# Patient Record
Sex: Female | Born: 1935 | Race: White | Hispanic: No | Marital: Single | Smoking: Never smoker
Health system: Southern US, Community
[De-identification: ages and names within clinical notes are randomized; demographics above are authoritative.]

## PROBLEM LIST (undated history)

## (undated) DIAGNOSIS — K219 Gastro-esophageal reflux disease without esophagitis: Secondary | ICD-10-CM

## (undated) DIAGNOSIS — I10 Essential (primary) hypertension: Secondary | ICD-10-CM

## (undated) DIAGNOSIS — D329 Benign neoplasm of meninges, unspecified: Secondary | ICD-10-CM

## (undated) DIAGNOSIS — M1990 Unspecified osteoarthritis, unspecified site: Secondary | ICD-10-CM

## (undated) DIAGNOSIS — E039 Hypothyroidism, unspecified: Secondary | ICD-10-CM

## (undated) DIAGNOSIS — C44601 Unspecified malignant neoplasm of skin of unspecified upper limb, including shoulder: Secondary | ICD-10-CM

## (undated) DIAGNOSIS — H409 Unspecified glaucoma: Secondary | ICD-10-CM

## (undated) HISTORY — DX: Unspecified glaucoma: H40.9

## (undated) HISTORY — PX: TONSILLECTOMY: SUR1361

## (undated) HISTORY — DX: Hypothyroidism, unspecified: E03.9

## (undated) HISTORY — DX: Benign neoplasm of meninges, unspecified: D32.9

## (undated) HISTORY — PX: CATARACT EXTRACTION W/PHACO: SHX586

## (undated) HISTORY — DX: Unspecified malignant neoplasm of skin of unspecified upper limb, including shoulder: C44.601

---
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
## 1973-03-13 HISTORY — PX: LAMINECTOMY: SHX219

## 1988-03-13 DIAGNOSIS — M1990 Unspecified osteoarthritis, unspecified site: Secondary | ICD-10-CM

## 1988-03-13 HISTORY — DX: Unspecified osteoarthritis, unspecified site: M19.90

## 2001-03-13 HISTORY — PX: APPENDECTOMY: SHX54

## 2003-03-14 HISTORY — PX: RETINAL DETACHMENT SURGERY: SHX105

## 2008-01-22 ENCOUNTER — Ambulatory Visit: Payer: Self-pay | Admitting: Cardiology

## 2009-07-14 ENCOUNTER — Ambulatory Visit (HOSPITAL_COMMUNITY): Admission: RE | Admit: 2009-07-14 | Discharge: 2009-07-14 | Payer: Self-pay | Admitting: Interventional Radiology

## 2010-04-03 ENCOUNTER — Encounter: Payer: Self-pay | Admitting: General Surgery

## 2010-05-31 LAB — CBC
HCT: 37.6 % (ref 36.0–46.0)
MCHC: 33.6 g/dL (ref 30.0–36.0)
Platelets: 329 10*3/uL (ref 150–400)
RDW: 14.8 % (ref 11.5–15.5)

## 2010-05-31 LAB — APTT: aPTT: 29 seconds (ref 24–37)

## 2010-05-31 LAB — PROTIME-INR: INR: 0.92 (ref 0.00–1.49)

## 2013-04-09 ENCOUNTER — Encounter (HOSPITAL_COMMUNITY): Payer: Self-pay | Admitting: Pharmacy Technician

## 2013-04-14 ENCOUNTER — Encounter (HOSPITAL_COMMUNITY)
Admission: RE | Disposition: A | Discharge status: Home / Self Care | Payer: Self-pay | Source: Ambulatory Visit | Attending: Ophthalmology

## 2013-04-14 ENCOUNTER — Ambulatory Visit (HOSPITAL_COMMUNITY)
Admission: RE | Admit: 2013-04-14 | Discharge: 2013-04-14 | Disposition: A | Discharge status: Home / Self Care | Payer: Medicare Other | Source: Ambulatory Visit | Attending: Ophthalmology | Admitting: Ophthalmology

## 2013-04-14 ENCOUNTER — Encounter (HOSPITAL_COMMUNITY): Payer: Self-pay

## 2013-04-14 DIAGNOSIS — H4089 Other specified glaucoma: Secondary | ICD-10-CM | POA: Insufficient documentation

## 2013-04-14 DIAGNOSIS — I10 Essential (primary) hypertension: Secondary | ICD-10-CM | POA: Insufficient documentation

## 2013-04-14 HISTORY — DX: Unspecified osteoarthritis, unspecified site: M19.90

## 2013-04-14 HISTORY — DX: Essential (primary) hypertension: I10

## 2013-04-14 HISTORY — DX: Gastro-esophageal reflux disease without esophagitis: K21.9

## 2013-04-14 HISTORY — PX: SLT LASER APPLICATION: SHX6099

## 2013-04-14 SURGERY — SLT LASER APPLICATION
Anesthesia: LOCAL | Laterality: Left

## 2013-04-14 MED ORDER — TETRACAINE HCL 0.5 % OP SOLN
1.0000 [drp] | Freq: Once | OPHTHALMIC | Status: AC
  Administered 2013-04-14: 1 [drp] via OPHTHALMIC

## 2013-04-14 MED ORDER — TETRACAINE HCL 0.5 % OP SOLN
OPHTHALMIC | Status: AC
  Filled 2013-04-14: qty 2

## 2013-04-14 MED ORDER — PILOCARPINE HCL 1 % OP SOLN
OPHTHALMIC | Status: AC
  Filled 2013-04-14: qty 15

## 2013-04-14 MED ORDER — APRACLONIDINE HCL 1 % OP SOLN
1.0000 [drp] | OPHTHALMIC | Status: AC
  Administered 2013-04-14 (×3): 1 [drp] via OPHTHALMIC
  Filled 2013-04-14: qty 0.1

## 2013-04-14 MED ORDER — PILOCARPINE HCL 1 % OP SOLN
1.0000 [drp] | OPHTHALMIC | Status: AC
  Administered 2013-04-14 (×3): 1 [drp] via OPHTHALMIC

## 2013-04-14 MED ORDER — APRACLONIDINE HCL 1 % OP SOLN
OPHTHALMIC | Status: AC
  Filled 2013-04-14: qty 0.1

## 2013-04-14 NOTE — Discharge Instructions (Signed)
Jennifer James  04/14/2013     Instructions    Activity: No Restrictions.   Diet: Resume Diet you were on at home.   Pain Medication: Tylenol if Needed.   CONTACT YOUR DOCTOR IF YOU HAVE PAIN, REDNESS IN YOUR EYE, OR DECREASED VISION.   Follow-up February 24th @ 1:45 pm} with Williams Che, MD.    Dr. Iona Hansen: [phone]      If you find that you cannot contact your physician, but feel that your signs and   Symptoms warrant a physician's attention, call the Emergency Room at   [phone] [phone].

## 2013-04-14 NOTE — Brief Op Note (Signed)
CUBA NATARAJAN 04/14/2013  Williams Che, MD  Yag Laser Self Test Completedyes. Procedure: SLT,OS.  Eye Protection Worn by Staff yes. Laser In Use Sign on Door yes.  Laser: Nd:YAG Spot Size: Fixed Power Setting: 1.0 mJ/burst Position treated: 360 degrees trabecular meshwork Number of shots: 122 Total energy delivered: 121 mJ  The patient tolerated the procedure without difficulty. No complications were encountered.   The trabecular meshwork was easily identified with no pigment and 2 clock hours of angle recession without synechiae. Tenometer reading immediately after procedure:  19 mmHg.  The patient was discharged home with the instructions to continue all her current glaucoma medications, if any.   Patient instructed to go to office at as scheduled for intraocular pressure check.  Patient verbalizes understanding of discharge instructions yes.

## 2013-04-14 NOTE — H&P (Signed)
I have reviewed the pre printed H&P, the patient was re-examined, and I have identified no significant interval changes in the patient's medical condition.  There is no change in the plan of care since the history and physical of record. 

## 2013-04-14 NOTE — Op Note (Signed)
Procedure only, no op note required

## 2013-04-17 ENCOUNTER — Encounter (HOSPITAL_COMMUNITY): Payer: Self-pay | Admitting: Ophthalmology

## 2015-06-07 ENCOUNTER — Inpatient Hospital Stay
Admission: RE | Admit: 2015-06-07 | Discharge: 2015-06-26 | Disposition: A | Discharge status: Home / Self Care | Payer: Medicare Other | Source: Ambulatory Visit | Attending: *Deleted | Admitting: *Deleted

## 2015-06-07 DIAGNOSIS — R600 Localized edema: Principal | ICD-10-CM

## 2015-06-07 DIAGNOSIS — M79605 Pain in left leg: Secondary | ICD-10-CM

## 2015-06-08 ENCOUNTER — Encounter (HOSPITAL_COMMUNITY)
Admission: RE | Admit: 2015-06-08 | Discharge: 2015-06-08 | Disposition: A | Discharge status: Home / Self Care | Payer: Medicare Other | Source: Skilled Nursing Facility | Attending: Internal Medicine | Admitting: Internal Medicine

## 2015-06-08 ENCOUNTER — Encounter: Payer: Self-pay | Admitting: Internal Medicine

## 2015-06-08 ENCOUNTER — Non-Acute Institutional Stay (SKILLED_NURSING_FACILITY): Payer: Medicare Other | Admitting: Internal Medicine

## 2015-06-08 DIAGNOSIS — I10 Essential (primary) hypertension: Secondary | ICD-10-CM | POA: Insufficient documentation

## 2015-06-08 DIAGNOSIS — D649 Anemia, unspecified: Secondary | ICD-10-CM | POA: Insufficient documentation

## 2015-06-08 DIAGNOSIS — K219 Gastro-esophageal reflux disease without esophagitis: Secondary | ICD-10-CM | POA: Insufficient documentation

## 2015-06-08 DIAGNOSIS — S72142D Displaced intertrochanteric fracture of left femur, subsequent encounter for closed fracture with routine healing: Secondary | ICD-10-CM | POA: Diagnosis not present

## 2015-06-08 DIAGNOSIS — E039 Hypothyroidism, unspecified: Secondary | ICD-10-CM | POA: Insufficient documentation

## 2015-06-08 DIAGNOSIS — S72143A Displaced intertrochanteric fracture of unspecified femur, initial encounter for closed fracture: Secondary | ICD-10-CM | POA: Insufficient documentation

## 2015-06-08 LAB — CBC
HEMATOCRIT: 25 % — AB (ref 36.0–46.0)
HEMOGLOBIN: 8.5 g/dL — AB (ref 12.0–15.0)
MCH: 32.3 pg (ref 26.0–34.0)
MCHC: 34 g/dL (ref 30.0–36.0)
MCV: 95.1 fL (ref 78.0–100.0)
Platelets: 275 10*3/uL (ref 150–400)
RBC: 2.63 MIL/uL — ABNORMAL LOW (ref 3.87–5.11)
RDW: 13.8 % (ref 11.5–15.5)
WBC: 8.5 10*3/uL (ref 4.0–10.5)

## 2015-06-08 LAB — BASIC METABOLIC PANEL
ANION GAP: 8 (ref 5–15)
BUN: 19 mg/dL (ref 6–20)
CHLORIDE: 107 mmol/L (ref 101–111)
CO2: 27 mmol/L (ref 22–32)
Calcium: 8.2 mg/dL — ABNORMAL LOW (ref 8.9–10.3)
Creatinine, Ser: 0.7 mg/dL (ref 0.44–1.00)
GFR calc non Af Amer: >60 mL/min (ref >60)
Glucose, Bld: 87 mg/dL (ref 65–99)
POTASSIUM: 3.6 mmol/L (ref 3.5–5.1)
Sodium: 142 mmol/L (ref 135–145)

## 2015-06-08 NOTE — Progress Notes (Signed)
This is a comprehensive admission to [facility] personally performed by Unice Cobble MD on this date less than 30 days from date of admission. PCP:Dr Bluth HPI: Patient was @ [facility]  3/21-3/27/17 for surgical treatment of a closed intertrochanteric fracture of left femur with intramedullary device 06/02/15.  The fracture was sustained in a mechanical fall with no history of cardio neurologic prodrome. Prophylactic Lovenox was administered for prevention of DVTs. Early ambulation was pursued along with sequential compression devices. Pain control was felt to be adequate on oral medications. There were no apparent operative complications; although, her H&H were 8.5 and 25 on 06/08/15. She denies a history of anemia or any bleeding dyscrasias except for minor bruising.  Past medical and surgical history: She has a history of hypertension, hypothyroidism, and Esophageal reflux. Pertinent past surgical history includes laminectomy in 1975.  Social history: She has never smoked. She does not drink. 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  Comprehensive review of systems: She has occasional dysphagia. She states that her TSH was therapeutic in October at her primary care physician's office. She felt as if she were going to have a panic attack upon arrival at this facility. This is atypical for her in the colon she states that she is totally independent individual Constitutional: No fever, chills, significant weight change, fatigue or night sweats Eyes: No redness, discharge, pain, blurred vision, double vision or loss of vision ENT/mouth: No nasal congestion, postnasal drainage,epistaxis, purulent discharge, earache, hearing loss, tinnitus ,sore throat , dental pain or hoarseness   Cardiovascular: No chest pain, palpitations, racing, irregular rhythm, syncope, claudication or edema    Respiratory: No cough, sputum production,hemoptysis,  dyspnea, paroxysmal nocturnal dyspnea, pleuritic chest pain, significant snoring or  apnea    Gastrointestinal: No heartburn,nausea and vomiting,abdominal pain, diarrhea, significant constipation, rectal bleeding or melena Genitourinary: No dysuria,hematuria, pyuria, frequency, urgency,  incontinence, nocturia, or dark urine  Musculoskeletal: No myalgias or muscle cramping, joint stiffness, joint swelling, joint color change, or weakness other than related to her hip fracture Dermatologic: No rash, pruritus, urticaria, or change in color or temperature of skin Neurologic: No headache, vertigo, limb weakness, tremor, gait disturbance, seizures, memory loss, numbness or tingling Psychiatric: No significant anxiety or depression, insomnia, or anorexia Endocrine: No change in hair/skin/ nails, excessive thirst, excessive hunger, excessive urination or unexplained fatigue Hematologic/lymphatic: No lymphadenopathy or  abnormal clotting Allergy/immunology: No itchy/ watery eyes, significant sneezing, rhinitis, urticaria or angioedema  Physical exam:  Pertinent or positive findings: She is a very articulate individual, animated and communicative. A faint left carotid bruit is noted. She has fusiform changes of her knees with crepitus.  General appearance:Adequately nourished; no acute distress or increased work of breathing is present.   Lymphatic: No  lymphadenopathy about the head, neck, or axilla . Eyes: No conjunctival inflammation or lid edema is present. There is no scleral icterus. Ears:  External ear exam shows no significant lesions or deformities.   Nose:  External nasal examination shows no deformity or inflammation. Nasal mucosa are pink and moist without lesions or exudates No septal dislocation or deviation.No obstruction to airflow.  Oral exam: Dental hygiene is excellent; lips and gums are healthy appearing.There is no oropharyngeal  erythema or exudate . Neck:  No deformities, thyromegaly, masses, or tenderness noted.     Heart:  Normal rate and regular rhythm. S1 and S2 normal  without gallop, murmur, click, rub or other extra sounds.  Lungs:Chest clear to auscultation; no wheezes, rhonchi,rales ,or rubs present. Abdomen:Bowel sounds are normal. Abdomen is soft and nontender with no organomegaly, hernias  or masses. GU: deferred as previously addressed. Extremities:  No cyanosis, edema, or clubbing noted. Neurologic exam :Strength equal & normal in upper & lower extremities Deep tendon reflexes are normal in biceps. Skin: Warm & dry w/o tenting or jaundice. No significant lesions or rash.  See summary under each active problem in the Problem List with associated updated therapeutic plan

## 2015-06-08 NOTE — Patient Instructions (Signed)
See summary under each active problem in the Problem List with associated updated therapeutic plan. Completed document faxed to [facility]. 

## 2015-06-08 NOTE — Assessment & Plan Note (Signed)
Continue physical therapy

## 2015-06-08 NOTE — Assessment & Plan Note (Signed)
Maintain blood pressure less than 140/90 on average

## 2015-06-08 NOTE — Assessment & Plan Note (Signed)
Serially monitor H&H

## 2015-06-09 ENCOUNTER — Encounter (HOSPITAL_COMMUNITY)
Admission: RE | Admit: 2015-06-09 | Discharge: 2015-06-09 | Disposition: A | Discharge status: Home / Self Care | Payer: Medicare Other | Source: Skilled Nursing Facility | Attending: *Deleted | Admitting: *Deleted

## 2015-06-09 LAB — CBC
HEMATOCRIT: 25.9 % — AB (ref 36.0–46.0)
HEMOGLOBIN: 8.6 g/dL — AB (ref 12.0–15.0)
MCH: 31.7 pg (ref 26.0–34.0)
MCHC: 33.2 g/dL (ref 30.0–36.0)
MCV: 95.6 fL (ref 78.0–100.0)
Platelets: 329 10*3/uL (ref 150–400)
RBC: 2.71 MIL/uL — ABNORMAL LOW (ref 3.87–5.11)
RDW: 14.1 % (ref 11.5–15.5)
WBC: 8.5 10*3/uL (ref 4.0–10.5)

## 2015-06-15 ENCOUNTER — Encounter: Payer: Self-pay | Admitting: Internal Medicine

## 2015-06-15 ENCOUNTER — Inpatient Hospital Stay (HOSPITAL_COMMUNITY)
Admit: 2015-06-15 | Discharge: 2015-06-15 | Disposition: A | Discharge status: Home / Self Care | Payer: Medicare Other | Attending: Internal Medicine | Admitting: Internal Medicine

## 2015-06-15 ENCOUNTER — Non-Acute Institutional Stay (SKILLED_NURSING_FACILITY): Payer: Medicare Other | Admitting: Internal Medicine

## 2015-06-15 DIAGNOSIS — E038 Other specified hypothyroidism: Secondary | ICD-10-CM

## 2015-06-15 DIAGNOSIS — S72142D Displaced intertrochanteric fracture of left femur, subsequent encounter for closed fracture with routine healing: Secondary | ICD-10-CM | POA: Diagnosis not present

## 2015-06-15 DIAGNOSIS — R609 Edema, unspecified: Secondary | ICD-10-CM

## 2015-06-15 DIAGNOSIS — D62 Acute posthemorrhagic anemia: Secondary | ICD-10-CM

## 2015-06-15 DIAGNOSIS — I10 Essential (primary) hypertension: Secondary | ICD-10-CM

## 2015-06-15 NOTE — Progress Notes (Signed)
Patient ID: Jennifer James, female   DOB: [birth year], 82 y.o.   MRN: [id number]    This is an acute visit.  Level care skilled.  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  Patient is a pleasant 82 year old female who sustained a fracture of her left hip  The fracture was sustained in a mechanical fall with no history of cardio neurologic prodrome. Prophylactic Lovenox was administered for prevention of DVTs. Status post repair Early ambulation was pursued along with sequential compression devices. Pain control was felt to be adequate on oral medications. There were no apparent operative complications; although, her H&H were 8.5 and 25 on 06/08/15. She denies a history of anemia or any bleeding dyscrasias except for minor bruising. She continues to work with therapy although she says she has some increased left hip discomfort at times.  She does have Vicodin as needed for pain.  Her RP as well as patient have noticed some increased edema of her left leg.  She is on aspirin 81 mg twice a day for prophylaxis.  Her other medical issues appear to be relatively stable.  She says she does feel bit chilly at night lower extremities she does have a history of hypothyroidism on Synthroid we will update a TSH.  Blood pressure appears to be stable most recently 128/67-120/64-115/71-she is on lisinopril 20 mg daily as well as Norvasc 10 mg a day.    Past medical and surgical history: She has a history of hypertension, hypothyroidism, and Esophageal reflux. Pertinent past surgical history includes laminectomy in 1975.  Social history: She has never smoked. She does not drink. 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  Medications have been reviewed.  They  include.  Aspirin 81 mg twice a day until the eighth 2017.  Cervix acid.  Calcium citrate 200 mg daily.  Vitamin D 3000 units daily.  Acerbic acid 500 mg twice a day.  Vitamin B12 1000 g daily.  Hydrocodone a CPT 5-3 25 mg-one tablet for moderate pain to test her severe pain when necessary every 6 hours.  Lisinopril 20 mg daily.  Norvasc 10 mg daily.  Ranitidine 150 mg twice a day.  Synthroid 88 g daily.    review of systems: She has occasional dysphagia. She states that her TSH was therapeutic in October at her primary care physician's office.   Constitutional: No fever, chills, significant weight change, fatigue or night sweats says her legs do feel cold at night Eyes: No redness, discharge, pain, blurred vision, double vision or loss of vision ENT/mouth: No nasal congestion, postnasal drainage,epistaxis, purulent discharge, earache, hearing loss, tinnitus ,sore throat , dental pain or hoarseness   Cardiovascular: No chest pain, palpitations, racing, irregular rhythm, syncope, claudication or edema except for left lower extremity edema Respiratory: No cough,    at times will have shortness of breath during exertion in therapy  Gastrointestinal: No heartburn,nausea and vomiting,abdominal pain, diarrhea, significant constipation, rectal bleeding or melena Genitourinary: No dysuria,  Musculoskeletal: No myalgias or muscle cramping, joint stiffness, joint swelling, joint color change, or weakness other than related to her hip fracture Dermatologic: No rash, pruritus, urticaria, or change in color or temperature of skin Neurologic:  No headache or dizziness Psychiatric: No significant anxiety or depression, insomnia, or anorexia  Endocrine: No change in hair/skin/ nails, excessive thirst, excessive hunger, excessive urination or unexplained fatigue does complain of some coldness of her lower extremities at night Hematologic/lymphatic: No lymphadenopathy or  abnormal clotting  some bruising around surgical site Allergy/immunology: No itchy/ watery eyes, significant sneezing, rhinitis, urticaria or angioedema  Physical exam:   Temp--98.8 pulse 83 respirations 20 blood pressure 128/67  General appearance:Adequately nourished; no acute distress or increased work of breathing is present.   . Eyes: No conjunctival inflammation or lid edema is present. There is no scleral icterus. Ears:  External ear exam shows no significant lesions or deformities.   Nose:  External nasal examination shows no deformity or inflammation. Nasal mucosa are pink and moist without lesions or exudates .  Oral exam: Dental hygiene is excellent; lips and gums are healthy appearing.There is no oropharyngeal erythema or exudate . Marland Kitchen     Heart:  Normal rate and regular rhythm. S1 and S2 normal without gallop, murmur, click, rub or other extra sounds.  Lungs:Chest clear to auscultation; no wheezes, rhonchi,rales ,or rubs present. Abdomen:Bowel sounds are normal. Abdomen is soft and nontender with no organomegaly, hernias  or masses.  Extremities:  Moves upper extremities at baseline-ambulates in a wheelchair without difficulty-I do note she has 1-2 plus edema of her left leg-this is nonerythematous with slight tenderness to palpation--without increased warmth-- has a reduced pedal pulse on the left versus the right Surgical site left hip discovered-there is a small amount of violaceous bruising extending beyond the dressing according to RP this is improving Neurologic exam :Strength equal & normal in upper & lower extremities-touch sensation appears to be intact lower extremities bilaterally-there is no increased warmth or coldness to touch--pedal pulse is intact right lower extremity Skin: Warm & dry w/o tenting or jaundice. No significant lesions or rash.  Psych she is alert and oriented pleasant and appropriate quite conversant.  Labs.  06/09/2015.  WBC 8.5 hemoglobin 8.6 platelets  329.  Sodium 142 potassium 3.6 BUN 19 creatinine 0.7.  Assessment and plan.  History of left hip fracture status post repair-apparently she is having some discomfort with therapy-will order an x-ray of the left hip and leg-she is receiving Vicodin for pain apparently this is helping.  In regards to increased edema on the left leg will order a venous Doppler to rule out any DVT-she is on aspirin twice a day for anticoagulation.  #3 history of hypothyroidism she is complaining of somewhat cold legs at night will start by ordering a TSH here she does have a history hypothyroidism apparently TSH was within normal limits in October-I did try to access what medical records we could from Epic but there is quite limited information since her providers  are outside it appears the [facility] system.  #4 history of hypertension this appears to be stable recent blood pressures as noted above she is on lisinopril as well as Norvasc.  #5 history of anemia I suspect postoperative appears previous hemoglobin for surgery was around 12 did drop to 8.5 and then updated at 8.6-will order a CBC tomorrow for follow-up.  Clinically patient appears to be doing relatively well although will have to do a venous Doppler secondary to left leg edema as well as an x-ray secondary to some pain-again will update blood work as well to keep an eye on her hemoglobin and order a BMP to keep an eye on her electrolytes renal function with a history of hypertension  [id number]-of note greater than 35 minutes spent assessing  patient-discussing her status with her responsible party at bedside-reviewing her chart and meds and labs-and coordinating form formulating a plan of care for numerous diagnoses-of note greater than 50% of time spent coordinating plan of care

## 2015-06-16 ENCOUNTER — Encounter (HOSPITAL_COMMUNITY)
Admission: RE | Admit: 2015-06-16 | Discharge: 2015-06-16 | Disposition: A | Discharge status: Home / Self Care | Payer: Medicare Other | Source: Skilled Nursing Facility | Attending: Internal Medicine | Admitting: Internal Medicine

## 2015-06-16 ENCOUNTER — Ambulatory Visit (HOSPITAL_COMMUNITY)
Admission: RE | Admit: 2015-06-16 | Discharge: 2015-06-16 | Disposition: A | Discharge status: Home / Self Care | Payer: Medicare Other | Source: Ambulatory Visit | Attending: Internal Medicine | Admitting: Internal Medicine

## 2015-06-16 ENCOUNTER — Ambulatory Visit (HOSPITAL_COMMUNITY)
Admit: 2015-06-16 | Discharge: 2015-06-16 | Disposition: A | Discharge status: Home / Self Care | Payer: Medicare Other | Attending: Internal Medicine | Admitting: Internal Medicine

## 2015-06-16 DIAGNOSIS — E039 Hypothyroidism, unspecified: Secondary | ICD-10-CM | POA: Insufficient documentation

## 2015-06-16 DIAGNOSIS — M79605 Pain in left leg: Secondary | ICD-10-CM | POA: Insufficient documentation

## 2015-06-16 DIAGNOSIS — M6281 Muscle weakness (generalized): Secondary | ICD-10-CM | POA: Insufficient documentation

## 2015-06-16 DIAGNOSIS — R600 Localized edema: Secondary | ICD-10-CM | POA: Insufficient documentation

## 2015-06-16 DIAGNOSIS — M810 Age-related osteoporosis without current pathological fracture: Secondary | ICD-10-CM | POA: Insufficient documentation

## 2015-06-16 DIAGNOSIS — I10 Essential (primary) hypertension: Secondary | ICD-10-CM | POA: Insufficient documentation

## 2015-06-16 DIAGNOSIS — Z4789 Encounter for other orthopedic aftercare: Secondary | ICD-10-CM | POA: Insufficient documentation

## 2015-06-16 LAB — CBC WITH DIFFERENTIAL/PLATELET
Basophils Absolute: 0 10*3/uL (ref 0.0–0.1)
Basophils Relative: 0 %
EOS ABS: 0.7 10*3/uL (ref 0.0–0.7)
EOS PCT: 7 %
HCT: 28.7 % — ABNORMAL LOW (ref 36.0–46.0)
Hemoglobin: 9.2 g/dL — ABNORMAL LOW (ref 12.0–15.0)
Lymphocytes Relative: 15 %
Lymphs Abs: 1.4 10*3/uL (ref 0.7–4.0)
MCH: 31.5 pg (ref 26.0–34.0)
MCHC: 32.1 g/dL (ref 30.0–36.0)
MCV: 98.3 fL (ref 78.0–100.0)
Monocytes Absolute: 0.8 10*3/uL (ref 0.1–1.0)
Monocytes Relative: 9 %
NEUTROS ABS: 6.2 10*3/uL (ref 1.7–7.7)
NEUTROS PCT: 69 %
PLATELETS: 592 10*3/uL — AB (ref 150–400)
RBC: 2.92 MIL/uL — AB (ref 3.87–5.11)
RDW: 15.5 % (ref 11.5–15.5)
WBC: 9.1 10*3/uL (ref 4.0–10.5)

## 2015-06-16 LAB — BASIC METABOLIC PANEL
ANION GAP: 7 (ref 5–15)
BUN: 19 mg/dL (ref 6–20)
CO2: 28 mmol/L (ref 22–32)
Calcium: 8.8 mg/dL — ABNORMAL LOW (ref 8.9–10.3)
Chloride: 107 mmol/L (ref 101–111)
Creatinine, Ser: 0.79 mg/dL (ref 0.44–1.00)
GFR calc Af Amer: >60 mL/min (ref >60)
GLUCOSE: 93 mg/dL (ref 65–99)
POTASSIUM: 4 mmol/L (ref 3.5–5.1)
Sodium: 142 mmol/L (ref 135–145)

## 2015-06-16 LAB — TSH: TSH: 5.296 u[IU]/mL — AB (ref 0.350–4.500)

## 2015-06-21 ENCOUNTER — Other Ambulatory Visit: Payer: Self-pay | Admitting: *Deleted

## 2015-06-21 MED ORDER — HYDROCODONE-ACETAMINOPHEN 5-325 MG PO TABS: 1.0000 | ORAL_TABLET | Freq: Four times a day (QID) | ORAL | Status: DC | PRN

## 2015-06-21 NOTE — Telephone Encounter (Signed)
[facility] 

## 2015-06-22 ENCOUNTER — Encounter: Payer: Self-pay | Admitting: Internal Medicine

## 2015-06-22 ENCOUNTER — Non-Acute Institutional Stay (SKILLED_NURSING_FACILITY): Payer: Medicare Other | Admitting: Internal Medicine

## 2015-06-22 DIAGNOSIS — E038 Other specified hypothyroidism: Secondary | ICD-10-CM | POA: Diagnosis not present

## 2015-06-22 DIAGNOSIS — D649 Anemia, unspecified: Secondary | ICD-10-CM

## 2015-06-22 DIAGNOSIS — I10 Essential (primary) hypertension: Secondary | ICD-10-CM

## 2015-06-22 DIAGNOSIS — S72142D Displaced intertrochanteric fracture of left femur, subsequent encounter for closed fracture with routine healing: Secondary | ICD-10-CM | POA: Diagnosis not present

## 2015-06-22 NOTE — Progress Notes (Signed)
Patient ID: Jennifer James, female   DOB: 1928-05-28, 82 y.o.   MRN: [id number]  Location:  [place] of Service:  SNF (31)    PCP: Celedonio Savage, MD Patient Care Team: Celedonio Savage, MD as PCP - General (Family Medicine)  Extended Emergency Contact Information Primary Emergency Contact: Mauri Brooklyn & Joseph Art [place] of [place] Phone: [phone] Relation: Other Secondary Emergency Contact: Deneen Harts States of [place] Phone: [phone] Relation: Friend   Goals of care:  Advanced Directive information Advanced Directives 06/22/2015  Does patient have an advance directive? Yes  Type of Advance Directive Out of facility DNR (pink MOST or yellow form)  Does patient want to make changes to advanced directive? No - Patient declined  Copy of advanced directive(s) in chart? Yes     Allergies  Allergen Reactions  . Penicillins Swelling and Other (See Comments)    Patient states she turns red    Chief Complaint  Patient presents with  . Discharge Note    HPI:  82 y.o. female  seen today for discharge-she was here for rehabilitation after sustaining a left hip fracture that was surgically repaired.  Her stay here is been fairly unremarkable she was complaining of some continued hip pain but x-rays did not show any acute process he also did a venous Doppler secondary to edema on the left side and this was negative as well.  She appears to be doing a bit better Vicodin appears to be controlling the pain when needed.  She did have postop anemia but hemoglobin has risen from the mid eights up to 9.2 on most recent lab.  She also has a history of hypertension on lisinopril and Norvasc this appears stable recent blood pressures 117/67-139/67.  Currently she has no complaints she is looking forward to going home she is fairly independent at home she does have close friends who do watch after her quite closely.  She will need PT and OT for  strengthening as well as a rolling walker.Wheelchair as well as shower chair secondary to continued fall risk      Past Medical History  Diagnosis Date  . Hypertension   . GERD (gastroesophageal reflux disease)   . Osteoarthritis 1990  . Hypothyroidism   . Glaucoma   . Meningioma ([place])     Monitored at [facility]  . Primary cancer of skin of hand     Past Surgical History  Procedure Laterality Date  . Cataract extraction w/phaco Bilateral   . Retinal detachment surgery Left 2005  . Tonsillectomy    . Appendectomy  2003  . Laminectomy  1975  . Strabismus surgery Bilateral 1955    6 total surgeries  . Slt laser application Left [id number]    Procedure: SLT LASER APPLICATION;  Surgeon: Williams Che, MD;  Location: [facility];  Service: Ophthalmology;  Laterality: Left;      reports that she has never smoked. She has never used smokeless tobacco. She reports that she does not drink alcohol or use illicit drugs. Social History   Social History  . Marital Status: Single    Spouse Name: N/A  . Number of Children: N/A  . Years of Education: N/A   Occupational History  . Not on file.   Social History Main Topics  . Smoking status: Never Smoker   . Smokeless tobacco: Never Used  . Alcohol Use: No  . Drug Use: No  . Sexual Activity: Not on  file   Other Topics Concern  . Not on file   Social History Narrative   Medications have been reviewed per [facility] include a cervical acid 500 mg twice a day.  Aspirin 81 mg twice a day until May 9 at which time we will cold once a day.  Calcium citrate 200 mg daily.  Vitamin D 1000 units daily.  Vitamin B12 thousand micrograms daily.  Vicodin 5-3 25 mg every 6 hours when necessary.  2 tabs every 6 hours for severe pain.  Lisinopril 20 mg daily.  Norvasc 10 mg daily.  Synthroid currently 100 g a day will reducethis to 88 g a day per Dr. Clayborn Heron assessment       Allergies  Allergen Reactions  . Penicillins  Swelling and Other (See Comments)    Patient states she turns red    Pertinent  Health Maintenance Due  Topic Date Due  . DEXA SCAN  09/19/1995  . PNA vac Low Risk Adult (1 of 2 - PCV13) 09/19/1995  . INFLUENZA VACCINE  10/12/2015    Medications:   Review of Systems   In general no complaints of fever or chills says she feels well looking forward to going home.  Skin does not complain of rashes or itching bruising around her surgical site appears to be improving.  Head ears eyes nose mouth and throat does not complain of visual changes or sore throat.  Respiratory does not complain of shortness breath or cough.  Cardiac no chest pain.  Edema left leg appears to be slightly improved DVT ruled out via Doppler.  GI does not complain of nausea vomiting diarrhea constipation or abdominal pain.  GU does not complain of dysuria.  Muscle skeletal still has joint pain in the hip Vicodin apparently is effective still has some weakness.  Neurologic is not complaining of dizziness headache or numbness.  In psych appears to be somewhat anxious at times but does not complain of depression or uncontrollable anxiety  Filed Vitals:   06/22/15 1144  BP: 117/67  Pulse: 85  Temp: 98.1 F (36.7 C)  TempSrc: Oral  Resp: 19  Height: 5\' 3"  (1.6 m)  Weight: 138 lb 3.2 oz (62.687 kg)   Body mass index is 24.49 kg/(m^2). Physical Exam   Temperature 98.1 pulse 85 respirations 19 blood pressure 117/67.  In general this is a pleasant elderly female in no distress sitting for flu in her wheelchair.  Her skin is warm and dry surgical site left hip currently covered with a bruising around side appears to be slowly resolving it is not warm or acutely tender.  Eyes pupils appear reactive to light sclera and conjunctiva are clear she has prescription lenses visual acuity appears grossly intact.  Oropharynx clear mucous membranes moist.  Chest is clear to auscultation there is no labored  breathing.  Heart is regular rate and rhythm without murmur gallop or rub continues with some left leg edema approximately 2+ however this appears to be  slighty improved somewhat from previous exam pedal pulse is intact.  Abdomen soft nontender positive bowel sounds.  Muscle skeletal she is able to get up unassisted in ambulate but still have some weakness here and slight unsteadiness.  Neurologic is grossly intact speech is clear no lateralizing findings.  Psych she is alert and oriented pleasant and appropriate  Labs reviewed: Basic Metabolic Panel:  Recent Labs  06/08/15 0720 06/16/15 0710  NA 142 142  K 3.6 4.0  CL 107 107  CO2  27 28  GLUCOSE 87 93  BUN 19 19  CREATININE 0.70 0.79  CALCIUM 8.2* 8.8*   Liver Function Tests: No results for input(s): AST, ALT, ALKPHOS, BILITOT, PROT, ALBUMIN in the last 8760 hours. No results for input(s): LIPASE, AMYLASE in the last 8760 hours. No results for input(s): AMMONIA in the last 8760 hours. CBC:  Recent Labs  06/08/15 0720 06/09/15 0740 06/16/15 0710  WBC 8.5 8.5 9.1  NEUTROABS  --   --  6.2  HGB 8.5* 8.6* 9.2*  HCT 25.0* 25.9* 28.7*  MCV 95.1 95.6 98.3  PLT 275 329 592*   Cardiac Enzymes: No results for input(s): CKTOTAL, CKMB, CKMBINDEX, TROPONINI in the last 8760 hours. BNP: Invalid input(s): POCBNP CBG: No results for input(s): GLUCAP in the last 8760 hours.  Procedures and Imaging Studies During Stay: Dg Pelvis 1-2 Views  06/16/2015  CLINICAL DATA:  Lt hip Fx x 2 wks ago. Pt states she has been having increased pain in Lt lower extremity x several days. Increased Lt lower extremity swelling. No new injury since surgery. Posterior bruising extending from Lt buttock to Lt lower leg. EXAM: PELVIS - 1-2 VIEW COMPARISON:  06/01/2015 FINDINGS: Since the prior exam, 2 screws supported by an intra medullary rod have reduces the intertrochanteric fracture. Fracture components are well aligned. There is no evidence of a  new fracture. The orthopedic hardware is well-seated with no evidence loosening. Hip joints, SI joints and symphysis pubis are normally aligned. Bones are diffusely demineralized. IMPRESSION: 1. No new fracture.  No dislocation. 2. Left intertrochanteric fracture components are well aligned following ORIF. Orthopedic hardware with no evidence of loosening. Electronically Signed   By: Lajean Manes M.D.   On: 06/16/2015 08:14   Dg Tibia/fibula Left  06/16/2015  CLINICAL DATA:  Lt hip Fx x 2 wks ago. Pt states she has been having increased pain in Lt lower extremity x several days. Increased Lt lower extremity swelling. No new injury since surgery. Posterior bruising extending from Lt buttock to Lt lower leg. EXAM: LEFT TIBIA AND FIBULA - 2 VIEW COMPARISON:  06/01/2015 FINDINGS: Marginal spurs about all 3 compartments of the knee. Narrowing of the articular cartilage most evident in the medial compartment. No fracture or dislocation. Distal tibia and fibula intact. The distal aspect of femoral IM rod is partially visualized. Calcaneal spur at the plantar aponeurosis. IMPRESSION: 1. Negative for fracture or other acute abnormality. 2. Tricompartmental DJD in the knee, most marked in the medial compartment. Electronically Signed   By: Lucrezia Europe M.D.   On: 06/16/2015 08:15   US Venous Img Lower Unilateral Left  06/16/2015  CLINICAL DATA:  82 year old female with left lower extremity pain, left hip fracture surgery 2 weeks ago. Initial encounter. EXAM: LEFT LOWER EXTREMITY VENOUS DOPPLER ULTRASOUND TECHNIQUE: Gray-scale sonography with graded compression, as well as color Doppler and duplex ultrasound were performed to evaluate the lower extremity deep venous systems from the level of the common femoral vein and including the common femoral, femoral, profunda femoral, popliteal and calf veins including the posterior tibial, peroneal and gastrocnemius veins when visible. The superficial great saphenous vein was also  interrogated. Spectral Doppler was utilized to evaluate flow at rest and with distal augmentation maneuvers in the common femoral, femoral and popliteal veins. COMPARISON:  Left tib-fib 06/15/2015 FINDINGS: Contralateral Common Femoral Vein: Respiratory phasicity is normal and symmetric with the symptomatic side. No evidence of thrombus. Normal compressibility. Common Femoral Vein: No evidence of thrombus. Normal compressibility, respiratory  phasicity and response to augmentation. Saphenofemoral Junction: No evidence of thrombus. Normal compressibility and flow on color Doppler imaging. Profunda Femoral Vein: No evidence of thrombus. Normal compressibility and flow on color Doppler imaging. Femoral Vein: No evidence of thrombus. Normal compressibility, respiratory phasicity and response to augmentation. Popliteal Vein: No evidence of thrombus. Normal compressibility, respiratory phasicity and response to augmentation. Calf Veins: Limited due to subcutaneous ache Dina. No thrombus identified. Superficial Great Saphenous Vein: No evidence of thrombus. Normal compressibility and flow on color Doppler imaging. Venous Reflux:  None. Other Findings: Extensive subcutaneous edema in the distal left lower extremity and ankle. IMPRESSION: 1.  No evidence of left lower extremity deep venous thrombosis. 2. Extensive subcutaneous edema in the distal left lower extremity. Electronically Signed   By: Genevie Ann M.D.   On: 06/16/2015 16:37   Dg Femur Min 2 Views Left  06/16/2015  CLINICAL DATA:  Lt hip Fx x 2 wks ago. Pt states she has been having increased pain in Lt lower extremity x several days. Increased Lt lower extremity swelling. No new injury since surgery. Posterior bruising extending from Lt buttock to Lt lower leg. EXAM: LEFT FEMUR 2 VIEWS COMPARISON:  06/01/2015 FINDINGS: Interval internal fixation across the intertrochanteric fracture with IM rod, 2 proximal and 1 distal interlocking screws, intact without surrounding  lucency. Fracture fragments in near anatomic alignment. Mild osteopenia. Tricompartmental knee degenerative spurring. No acute fracture or dislocation. IMPRESSION: 1. Interval ORIF of left femur intertrochanteric fracture Electronically Signed   By: Lucrezia Europe M.D.   On: 06/16/2015 08:14    Assessment/Plan:    1 history of left hip fracture status post-this appears stable she will need continued PT and OT has Vicodin for pain control is on aspirin 81 mg twice a day for 6 week course for DVT prophylaxis again Doppler was negative of left leg. She also will need a rolling walker as well as shower chair and wheelchair for ambulation secondary to continued weakness and risk for falls  #2-history of hypertension this appears stable on lisinopril and Norvasc as noted above.  #3 history of postop anemia hemoglobin appears to be rising currently at 9.2.  We will update this before discharge.  #4-history hypothyroidism TSH came back mildly elevated at 5.296 at this point will continue dose of 88 g per Dr. Clayborn Heron assessment-patient is stable at this dose and suspect a dose increase would not be warranted per assessment by Dr. Linna Darner  [phone] note greater than 30 minutes spent on this discharge summary-greater than 50% of time spent coordinating plan of care

## 2015-06-23 ENCOUNTER — Encounter (HOSPITAL_COMMUNITY)
Admission: RE | Admit: 2015-06-23 | Discharge: 2015-06-23 | Disposition: A | Discharge status: Home / Self Care | Payer: Medicare Other | Source: Skilled Nursing Facility | Attending: Internal Medicine | Admitting: Internal Medicine

## 2015-06-23 ENCOUNTER — Other Ambulatory Visit: Payer: Self-pay

## 2015-06-23 LAB — CBC
HCT: 31.6 % — ABNORMAL LOW (ref 36.0–46.0)
HEMOGLOBIN: 10.1 g/dL — AB (ref 12.0–15.0)
MCH: 31.4 pg (ref 26.0–34.0)
MCHC: 32 g/dL (ref 30.0–36.0)
MCV: 98.1 fL (ref 78.0–100.0)
Platelets: 584 10*3/uL — ABNORMAL HIGH (ref 150–400)
RBC: 3.22 MIL/uL — ABNORMAL LOW (ref 3.87–5.11)
RDW: 15.2 % (ref 11.5–15.5)
WBC: 9.1 10*3/uL (ref 4.0–10.5)

## 2015-06-23 MED ORDER — HYDROCODONE-ACETAMINOPHEN 5-325 MG PO TABS: 1.0000 | ORAL_TABLET | Freq: Four times a day (QID) | ORAL | Status: AC | PRN

## 2015-06-23 NOTE — Telephone Encounter (Signed)
Rx faxed to [facility] at [phone].   Phone #: [phone]  

## 2015-06-27 NOTE — Progress Notes (Signed)
Patient ID: Jennifer James, female   DOB: 1928/09/11, 82 y.o.   MRN: [id number]

## 2015-07-14 ENCOUNTER — Encounter (HOSPITAL_COMMUNITY)
Admission: RE | Admit: 2015-07-14 | Discharge: 2015-07-14 | Disposition: A | Discharge status: Home / Self Care | Payer: Medicare Other | Source: Skilled Nursing Facility | Attending: Internal Medicine | Admitting: Internal Medicine

## 2015-07-14 DIAGNOSIS — I10 Essential (primary) hypertension: Secondary | ICD-10-CM | POA: Insufficient documentation

## 2015-07-14 DIAGNOSIS — Z4789 Encounter for other orthopedic aftercare: Secondary | ICD-10-CM | POA: Insufficient documentation

## 2015-07-14 DIAGNOSIS — E039 Hypothyroidism, unspecified: Secondary | ICD-10-CM | POA: Insufficient documentation

## 2015-07-14 DIAGNOSIS — M6281 Muscle weakness (generalized): Secondary | ICD-10-CM | POA: Insufficient documentation

## 2015-07-14 DIAGNOSIS — M810 Age-related osteoporosis without current pathological fracture: Secondary | ICD-10-CM | POA: Insufficient documentation

## 2015-07-22 ENCOUNTER — Encounter (HOSPITAL_COMMUNITY)
Admission: AD | Admit: 2015-07-22 | Discharge: 2015-07-22 | Disposition: A | Discharge status: Home / Self Care | Payer: Medicare Other | Source: Skilled Nursing Facility | Attending: *Deleted | Admitting: *Deleted

## 2017-06-20 IMAGING — DX DG FEMUR 2+V*L*
4 series · 4 of 4 positions shown · non-contrast
Comparison: 06/01/2015

CLINICAL DATA: Lt hip Fx x 2 wks ago. Pt states she has been having
increased pain in Lt lower extremity x several days. Increased Lt
lower extremity swelling. No new injury since surgery. Posterior
bruising extending from Lt buttock to Lt lower leg.

EXAM:
LEFT FEMUR 2 VIEWS

[femur ap (1 of 2)]
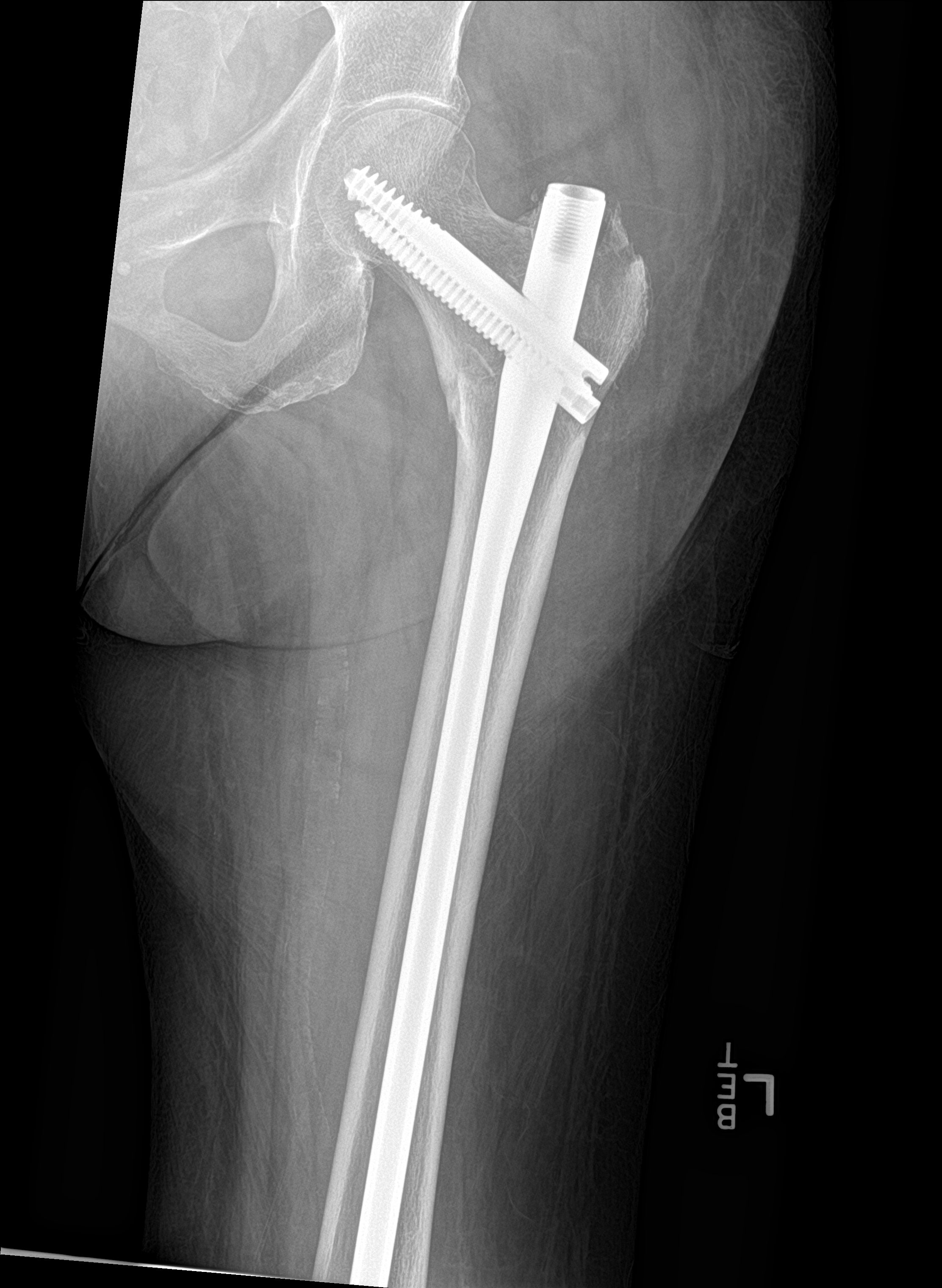

[femur ap (2 of 2)]
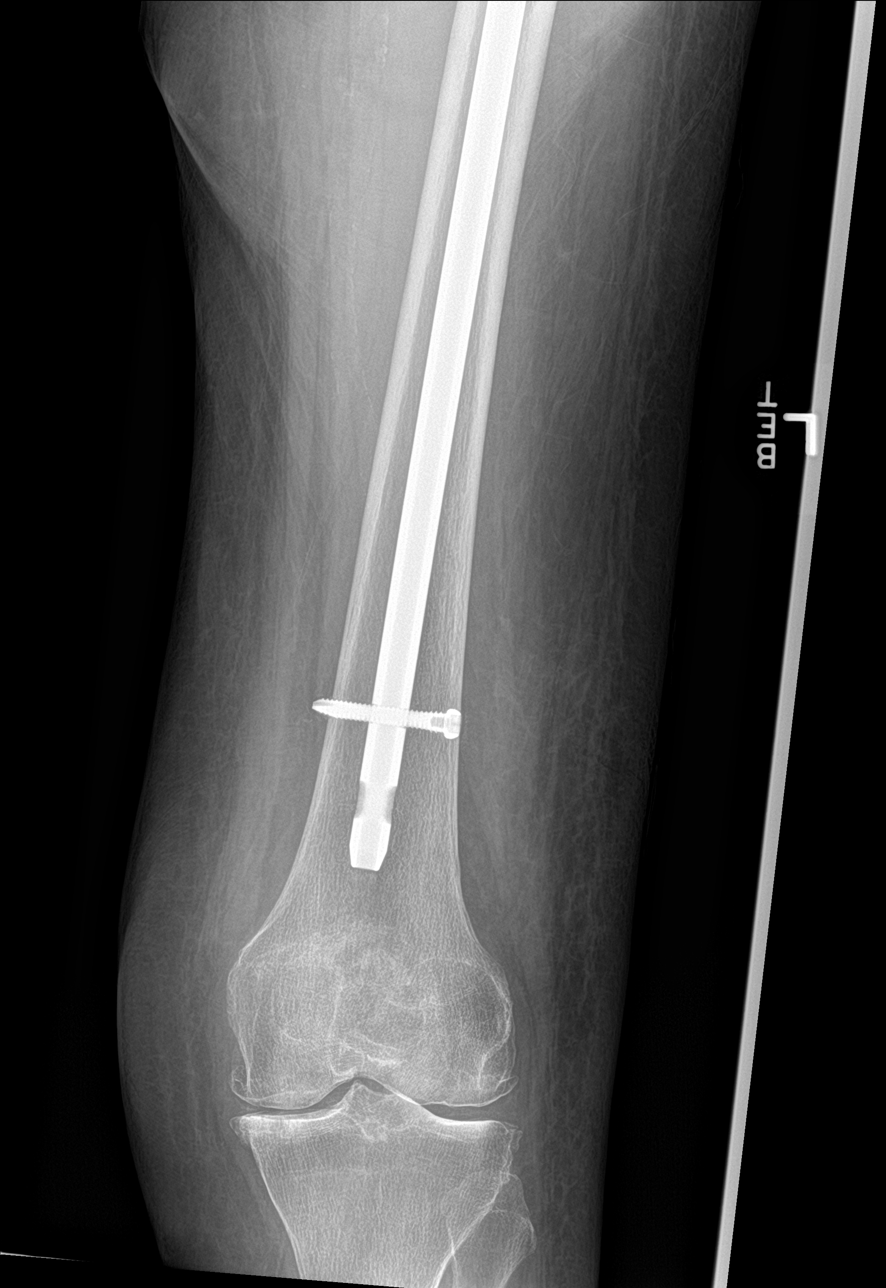

[femur lat (1 of 2)]
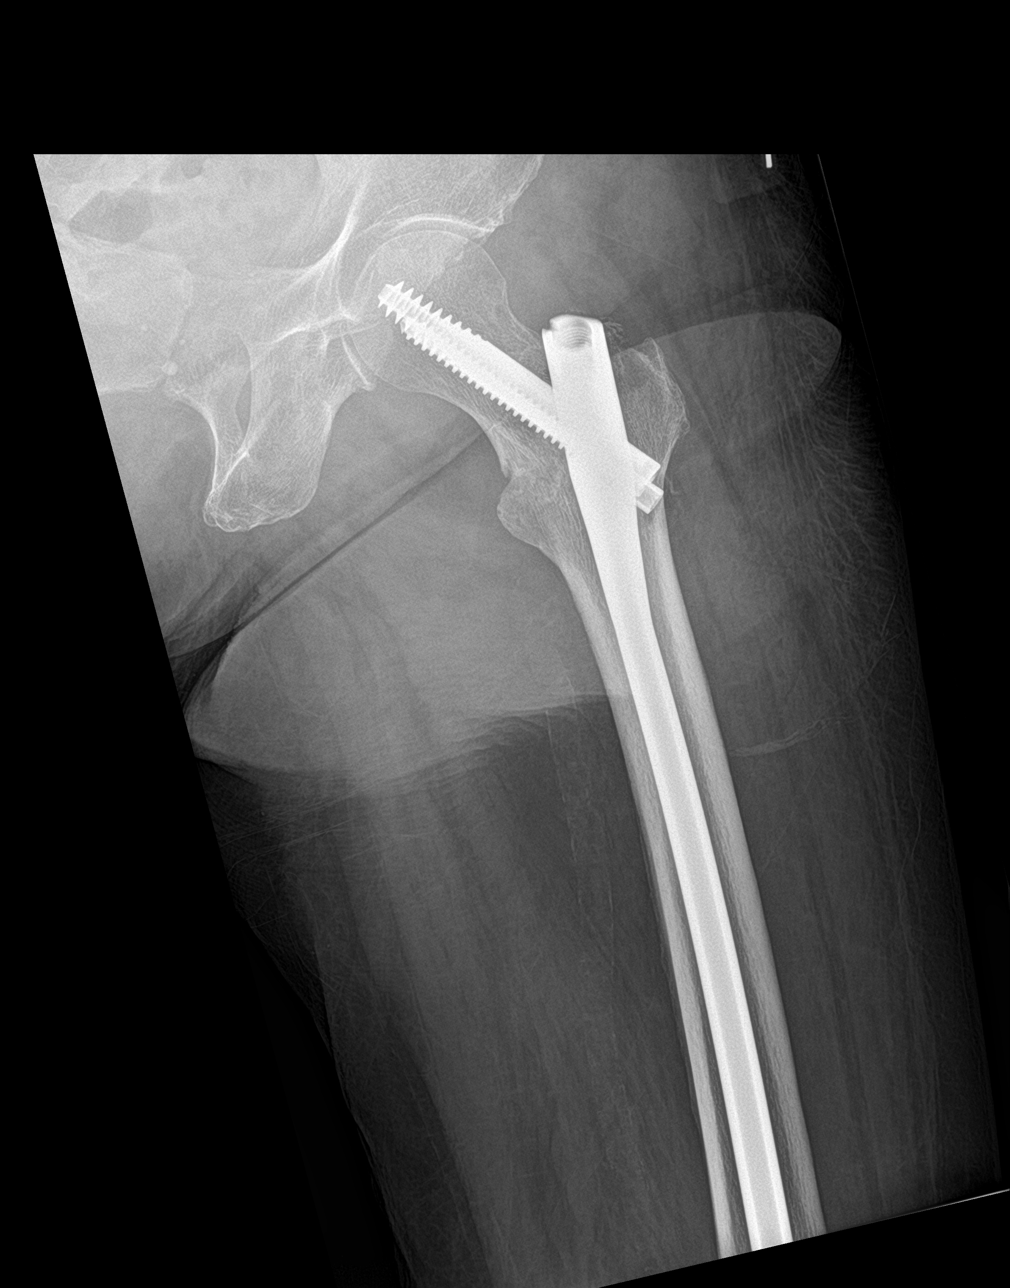

[femur lat (2 of 2)]
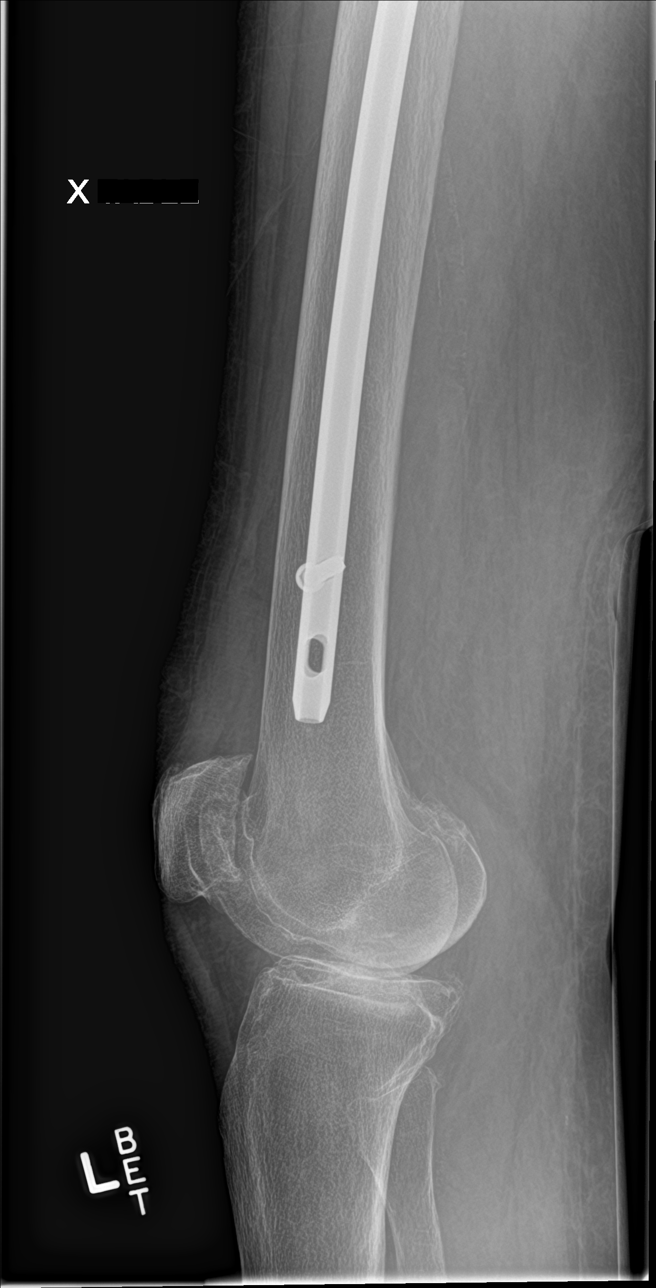

[4 of 4 positions shown; findings below may reference images not displayed]

FINDINGS: Interval internal fixation across the intertrochanteric fracture
with IM rod, 2 proximal and 1 distal interlocking screws, intact
without surrounding lucency. Fracture fragments in near anatomic
alignment. Mild osteopenia. Tricompartmental knee degenerative
spurring. No acute fracture or dislocation.
IMPRESSION: 1. Interval ORIF of left femur intertrochanteric fracture

## 2020-08-03 ENCOUNTER — Other Ambulatory Visit: Payer: Self-pay

## 2020-08-03 ENCOUNTER — Ambulatory Visit (INDEPENDENT_AMBULATORY_CARE_PROVIDER_SITE_OTHER): Payer: Medicare PPO | Admitting: Podiatry

## 2020-08-03 DIAGNOSIS — L84 Corns and callosities: Secondary | ICD-10-CM

## 2020-08-03 DIAGNOSIS — M79674 Pain in right toe(s): Secondary | ICD-10-CM

## 2020-08-03 DIAGNOSIS — M2041 Other hammer toe(s) (acquired), right foot: Secondary | ICD-10-CM

## 2020-08-03 NOTE — Patient Instructions (Signed)
Look at getting an extra-depth shoe

## 2020-08-09 NOTE — Progress Notes (Signed)
Subjective:   Patient ID: Jennifer James, female   DOB: 87 y.o.   MRN: [id number]   HPI 87 year old female presents the office today for concerns of a painful lesion on top of her right second toe which is formed.  She has not seen any drainage or pus or any swelling or redness.  She said no recent treatment.  Denies any recent injury.  She has no other concerns today.   Review of Systems  All other systems reviewed and are negative.  Past Medical History:  Diagnosis Date  . GERD (gastroesophageal reflux disease)   . Glaucoma   . Hypertension   . Hypothyroidism   . Meningioma ([place])    Monitored at [facility]  . Primary cancer of skin of hand     Past Surgical History:  Procedure Laterality Date  . APPENDECTOMY  2003  . CATARACT EXTRACTION W/PHACO Bilateral   . LAMINECTOMY  1975  . RETINAL DETACHMENT SURGERY Left 2005  . SLT LASER APPLICATION Left 09/12/7104   Procedure: SLT LASER APPLICATION;  Surgeon: Williams Che, MD;  Location: [facility];  Service: Ophthalmology;  Laterality: Left;  . STRABISMUS SURGERY Bilateral 1955   6 total surgeries  . TONSILLECTOMY       Current Outpatient Medications:  .  amLODipine (NORVASC) 10 MG tablet, Take 10 mg by mouth daily., Disp: , Rfl:  .  aspirin EC 81 MG tablet, 1 by mouth 2 times daily for blood clot prevention. Take 1 by mouth 2 times daily for 6 weeks until 07/29/15 then resume 1 by mouth daily., Disp: , Rfl:  .  Calcium 200 MG TABS, Take by mouth daily., Disp: , Rfl:  .  Cholecalciferol (VITAMIN D PO), Take 1 tablet by mouth daily. 1000 units, Disp: , Rfl:  .  Cyanocobalamin (VITAMIN B-12 PO), Take 1 tablet by mouth daily., Disp: , Rfl:  .  HYDROcodone-acetaminophen (NORCO/VICODIN) 5-325 MG tablet, Take 1-2 tablets by mouth every 6 (six) hours as needed for moderate pain or severe pain. Max APAP 3gm/24 hours from all sources, Disp: 240 tablet, Rfl: 0 .  levothyroxine (SYNTHROID) 100 MCG tablet, Take 100 mcg  by mouth daily before breakfast., Disp: , Rfl:  .  lisinopril (PRINIVIL,ZESTRIL) 20 MG tablet, Take 20 mg by mouth daily., Disp: , Rfl:  .  ranitidine (ZANTAC) 150 MG capsule, Take 150 mg by mouth 2 (two) times daily., Disp: , Rfl:  .  vitamin C (ASCORBIC ACID) 500 MG tablet, Take 500 mg by mouth 2 (two) times daily. , Disp: , Rfl:   Allergies  Allergen Reactions  . Penicillins Swelling and Other (See Comments)    Patient states she turns red          Objective:  Physical Exam  General: AAO x3, NAD  Dermatological: Hyperkeratotic lesion of the dorsal aspect of the right second digit.  Upon debridement there is no underlying ulceration drainage or any signs of infection.  There is dried blood present in the callus but there is no drainage or bleeding today.  No open lesions otherwise.  Vascular: Dorsalis Pedis artery and Posterior Tibial artery pedal pulses are palpable bilateral with immedate capillary fill time. There is no pain with calf compression, swelling, warmth, erythema.   Neruologic: Grossly intact via light touch bilateral.   Musculoskeletal: Rigid hammertoes present of the right second digit resulting in the hyperkeratotic lesion.  Mild tenderness palpation on the hyperkeratotic lesion but no other areas of  discomfort identified today.     Assessment:   87 year old female with hyperkeratotic lesion due to underlying hammertoe deformity     Plan:  -Treatment options discussed including all alternatives, risks, and complications -Etiology of symptoms were discussed -Sharply debrided the hyperkeratotic lesion x1 without any complications or bleeding. -Regards to the hammertoe discussed with her extra-depth, double duct shoes.  Offloading pads were dispensed. -Monitor for any clinical signs or symptoms of infection and directed to call the office immediately should any occur or go to the ER.  Trula Slade DPM

## 2020-08-24 DIAGNOSIS — I10 Essential (primary) hypertension: Secondary | ICD-10-CM | POA: Diagnosis not present

## 2020-08-24 DIAGNOSIS — R42 Dizziness and giddiness: Secondary | ICD-10-CM | POA: Diagnosis not present

## 2020-08-27 DIAGNOSIS — J320 Chronic maxillary sinusitis: Secondary | ICD-10-CM | POA: Diagnosis not present

## 2020-08-27 DIAGNOSIS — R42 Dizziness and giddiness: Secondary | ICD-10-CM | POA: Diagnosis not present

## 2020-08-27 DIAGNOSIS — I6789 Other cerebrovascular disease: Secondary | ICD-10-CM | POA: Diagnosis not present

## 2020-09-08 DIAGNOSIS — M170 Bilateral primary osteoarthritis of knee: Secondary | ICD-10-CM | POA: Diagnosis not present

## 2020-09-08 DIAGNOSIS — M25562 Pain in left knee: Secondary | ICD-10-CM | POA: Diagnosis not present

## 2020-09-08 DIAGNOSIS — M25561 Pain in right knee: Secondary | ICD-10-CM | POA: Diagnosis not present

## 2020-09-08 DIAGNOSIS — E0590 Thyrotoxicosis, unspecified without thyrotoxic crisis or storm: Secondary | ICD-10-CM | POA: Diagnosis not present

## 2020-10-05 DIAGNOSIS — E0590 Thyrotoxicosis, unspecified without thyrotoxic crisis or storm: Secondary | ICD-10-CM | POA: Diagnosis not present

## 2020-11-11 DIAGNOSIS — Z7982 Long term (current) use of aspirin: Secondary | ICD-10-CM | POA: Diagnosis not present

## 2020-11-11 DIAGNOSIS — R269 Unspecified abnormalities of gait and mobility: Secondary | ICD-10-CM | POA: Diagnosis not present

## 2020-11-11 DIAGNOSIS — G8929 Other chronic pain: Secondary | ICD-10-CM | POA: Diagnosis not present

## 2020-11-11 DIAGNOSIS — M810 Age-related osteoporosis without current pathological fracture: Secondary | ICD-10-CM | POA: Diagnosis not present

## 2020-11-11 DIAGNOSIS — E039 Hypothyroidism, unspecified: Secondary | ICD-10-CM | POA: Diagnosis not present

## 2020-11-11 DIAGNOSIS — R69 Illness, unspecified: Secondary | ICD-10-CM | POA: Diagnosis not present

## 2020-11-11 DIAGNOSIS — I10 Essential (primary) hypertension: Secondary | ICD-10-CM | POA: Diagnosis not present

## 2020-11-11 DIAGNOSIS — K219 Gastro-esophageal reflux disease without esophagitis: Secondary | ICD-10-CM | POA: Diagnosis not present

## 2020-11-11 DIAGNOSIS — R059 Cough, unspecified: Secondary | ICD-10-CM | POA: Diagnosis not present

## 2020-11-11 DIAGNOSIS — G629 Polyneuropathy, unspecified: Secondary | ICD-10-CM | POA: Diagnosis not present

## 2020-11-25 DIAGNOSIS — E039 Hypothyroidism, unspecified: Secondary | ICD-10-CM | POA: Diagnosis not present

## 2021-02-10 DIAGNOSIS — H43393 Other vitreous opacities, bilateral: Secondary | ICD-10-CM | POA: Diagnosis not present

## 2021-02-11 DIAGNOSIS — H43811 Vitreous degeneration, right eye: Secondary | ICD-10-CM | POA: Diagnosis not present

## 2021-02-11 DIAGNOSIS — H59812 Chorioretinal scars after surgery for detachment, left eye: Secondary | ICD-10-CM | POA: Diagnosis not present

## 2021-02-11 DIAGNOSIS — H35372 Puckering of macula, left eye: Secondary | ICD-10-CM | POA: Diagnosis not present

## 2021-02-11 DIAGNOSIS — T8522XA Displacement of intraocular lens, initial encounter: Secondary | ICD-10-CM | POA: Diagnosis not present

## 2021-08-11 DIAGNOSIS — E039 Hypothyroidism, unspecified: Secondary | ICD-10-CM | POA: Diagnosis not present

## 2021-08-11 DIAGNOSIS — Z008 Encounter for other general examination: Secondary | ICD-10-CM | POA: Diagnosis not present

## 2021-08-11 DIAGNOSIS — G8929 Other chronic pain: Secondary | ICD-10-CM | POA: Diagnosis not present

## 2021-08-11 DIAGNOSIS — Z8249 Family history of ischemic heart disease and other diseases of the circulatory system: Secondary | ICD-10-CM | POA: Diagnosis not present

## 2021-08-11 DIAGNOSIS — I10 Essential (primary) hypertension: Secondary | ICD-10-CM | POA: Diagnosis not present

## 2021-09-19 DIAGNOSIS — Z0001 Encounter for general adult medical examination with abnormal findings: Secondary | ICD-10-CM | POA: Diagnosis not present

## 2021-09-19 DIAGNOSIS — I10 Essential (primary) hypertension: Secondary | ICD-10-CM | POA: Diagnosis not present

## 2021-09-19 DIAGNOSIS — Z13220 Encounter for screening for lipoid disorders: Secondary | ICD-10-CM | POA: Diagnosis not present

## 2021-09-19 DIAGNOSIS — R42 Dizziness and giddiness: Secondary | ICD-10-CM | POA: Diagnosis not present

## 2021-09-19 DIAGNOSIS — R609 Edema, unspecified: Secondary | ICD-10-CM | POA: Diagnosis not present

## 2021-09-19 DIAGNOSIS — Z131 Encounter for screening for diabetes mellitus: Secondary | ICD-10-CM | POA: Diagnosis not present

## 2021-09-19 DIAGNOSIS — E039 Hypothyroidism, unspecified: Secondary | ICD-10-CM | POA: Diagnosis not present

## 2021-09-22 DIAGNOSIS — Z6824 Body mass index (BMI) 24.0-24.9, adult: Secondary | ICD-10-CM | POA: Diagnosis not present

## 2021-09-22 DIAGNOSIS — M25561 Pain in right knee: Secondary | ICD-10-CM | POA: Diagnosis not present

## 2021-09-22 DIAGNOSIS — M25562 Pain in left knee: Secondary | ICD-10-CM | POA: Diagnosis not present

## 2021-09-22 DIAGNOSIS — K219 Gastro-esophageal reflux disease without esophagitis: Secondary | ICD-10-CM | POA: Diagnosis not present

## 2021-09-22 DIAGNOSIS — E039 Hypothyroidism, unspecified: Secondary | ICD-10-CM | POA: Diagnosis not present

## 2021-09-22 DIAGNOSIS — I10 Essential (primary) hypertension: Secondary | ICD-10-CM | POA: Diagnosis not present

## 2021-09-22 DIAGNOSIS — Z0000 Encounter for general adult medical examination without abnormal findings: Secondary | ICD-10-CM | POA: Diagnosis not present

## 2021-10-05 DIAGNOSIS — R079 Chest pain, unspecified: Secondary | ICD-10-CM | POA: Diagnosis not present

## 2021-10-17 DIAGNOSIS — S40812A Abrasion of left upper arm, initial encounter: Secondary | ICD-10-CM | POA: Diagnosis not present

## 2021-10-17 DIAGNOSIS — R030 Elevated blood-pressure reading, without diagnosis of hypertension: Secondary | ICD-10-CM | POA: Diagnosis not present

## 2021-10-17 DIAGNOSIS — Z6824 Body mass index (BMI) 24.0-24.9, adult: Secondary | ICD-10-CM | POA: Diagnosis not present

## 2021-10-17 DIAGNOSIS — M1712 Unilateral primary osteoarthritis, left knee: Secondary | ICD-10-CM | POA: Diagnosis not present

## 2021-10-17 DIAGNOSIS — M1711 Unilateral primary osteoarthritis, right knee: Secondary | ICD-10-CM | POA: Diagnosis not present

## 2022-02-01 DIAGNOSIS — E860 Dehydration: Secondary | ICD-10-CM | POA: Diagnosis not present

## 2022-02-01 DIAGNOSIS — R4182 Altered mental status, unspecified: Secondary | ICD-10-CM | POA: Diagnosis not present

## 2022-02-01 DIAGNOSIS — Z743 Need for continuous supervision: Secondary | ICD-10-CM | POA: Diagnosis not present

## 2022-02-01 DIAGNOSIS — R404 Transient alteration of awareness: Secondary | ICD-10-CM | POA: Diagnosis not present

## 2022-02-01 DIAGNOSIS — N390 Urinary tract infection, site not specified: Secondary | ICD-10-CM | POA: Diagnosis not present

## 2022-02-01 DIAGNOSIS — R6889 Other general symptoms and signs: Secondary | ICD-10-CM | POA: Diagnosis not present

## 2022-02-01 DIAGNOSIS — Z20822 Contact with and (suspected) exposure to covid-19: Secondary | ICD-10-CM | POA: Diagnosis not present

## 2022-02-02 DIAGNOSIS — F0390 Unspecified dementia without behavioral disturbance: Secondary | ICD-10-CM | POA: Diagnosis not present

## 2022-02-02 DIAGNOSIS — Z7901 Long term (current) use of anticoagulants: Secondary | ICD-10-CM | POA: Diagnosis not present

## 2022-02-02 DIAGNOSIS — Z880 Allergy status to penicillin: Secondary | ICD-10-CM | POA: Diagnosis not present

## 2022-02-02 DIAGNOSIS — R69 Illness, unspecified: Secondary | ICD-10-CM | POA: Diagnosis not present

## 2022-02-02 DIAGNOSIS — J811 Chronic pulmonary edema: Secondary | ICD-10-CM | POA: Diagnosis not present

## 2022-02-02 DIAGNOSIS — R54 Age-related physical debility: Secondary | ICD-10-CM | POA: Diagnosis not present

## 2022-02-02 DIAGNOSIS — R509 Fever, unspecified: Secondary | ICD-10-CM | POA: Diagnosis not present

## 2022-02-02 DIAGNOSIS — Z79890 Hormone replacement therapy (postmenopausal): Secondary | ICD-10-CM | POA: Diagnosis not present

## 2022-02-02 DIAGNOSIS — Z79899 Other long term (current) drug therapy: Secondary | ICD-10-CM | POA: Diagnosis not present

## 2022-02-02 DIAGNOSIS — A419 Sepsis, unspecified organism: Secondary | ICD-10-CM | POA: Diagnosis not present

## 2022-02-02 DIAGNOSIS — Z1152 Encounter for screening for COVID-19: Secondary | ICD-10-CM | POA: Diagnosis not present

## 2022-02-02 DIAGNOSIS — R0600 Dyspnea, unspecified: Secondary | ICD-10-CM | POA: Diagnosis not present

## 2022-02-02 DIAGNOSIS — I10 Essential (primary) hypertension: Secondary | ICD-10-CM | POA: Diagnosis not present

## 2022-02-02 DIAGNOSIS — D72829 Elevated white blood cell count, unspecified: Secondary | ICD-10-CM | POA: Diagnosis not present

## 2022-02-02 DIAGNOSIS — I700 Atherosclerosis of aorta: Secondary | ICD-10-CM | POA: Diagnosis not present

## 2022-02-02 DIAGNOSIS — E039 Hypothyroidism, unspecified: Secondary | ICD-10-CM | POA: Diagnosis not present

## 2022-02-02 DIAGNOSIS — R4701 Aphasia: Secondary | ICD-10-CM | POA: Diagnosis not present

## 2022-02-02 DIAGNOSIS — I083 Combined rheumatic disorders of mitral, aortic and tricuspid valves: Secondary | ICD-10-CM | POA: Diagnosis not present

## 2022-02-02 DIAGNOSIS — I639 Cerebral infarction, unspecified: Secondary | ICD-10-CM | POA: Diagnosis not present

## 2022-02-02 DIAGNOSIS — R109 Unspecified abdominal pain: Secondary | ICD-10-CM | POA: Diagnosis not present

## 2022-02-02 DIAGNOSIS — N390 Urinary tract infection, site not specified: Secondary | ICD-10-CM | POA: Diagnosis not present

## 2022-02-02 DIAGNOSIS — I110 Hypertensive heart disease with heart failure: Secondary | ICD-10-CM | POA: Diagnosis not present

## 2022-02-02 DIAGNOSIS — Z792 Long term (current) use of antibiotics: Secondary | ICD-10-CM | POA: Diagnosis not present

## 2022-02-02 DIAGNOSIS — R4182 Altered mental status, unspecified: Secondary | ICD-10-CM | POA: Diagnosis not present

## 2022-02-02 DIAGNOSIS — R809 Proteinuria, unspecified: Secondary | ICD-10-CM | POA: Diagnosis not present

## 2022-02-02 DIAGNOSIS — R29818 Other symptoms and signs involving the nervous system: Secondary | ICD-10-CM | POA: Diagnosis not present

## 2022-02-02 DIAGNOSIS — K529 Noninfective gastroenteritis and colitis, unspecified: Secondary | ICD-10-CM | POA: Diagnosis not present

## 2022-02-02 DIAGNOSIS — J9811 Atelectasis: Secondary | ICD-10-CM | POA: Diagnosis not present

## 2022-02-02 DIAGNOSIS — Z7982 Long term (current) use of aspirin: Secondary | ICD-10-CM | POA: Diagnosis not present

## 2022-02-02 DIAGNOSIS — Z66 Do not resuscitate: Secondary | ICD-10-CM | POA: Diagnosis not present

## 2022-02-02 DIAGNOSIS — E8809 Other disorders of plasma-protein metabolism, not elsewhere classified: Secondary | ICD-10-CM | POA: Diagnosis not present

## 2022-02-02 DIAGNOSIS — Z20822 Contact with and (suspected) exposure to covid-19: Secondary | ICD-10-CM | POA: Diagnosis not present

## 2022-02-02 DIAGNOSIS — R410 Disorientation, unspecified: Secondary | ICD-10-CM | POA: Diagnosis not present

## 2022-02-02 DIAGNOSIS — E876 Hypokalemia: Secondary | ICD-10-CM | POA: Diagnosis not present

## 2022-02-03 DIAGNOSIS — Z79899 Other long term (current) drug therapy: Secondary | ICD-10-CM | POA: Diagnosis not present

## 2022-02-03 DIAGNOSIS — R4701 Aphasia: Secondary | ICD-10-CM | POA: Diagnosis not present

## 2022-02-03 DIAGNOSIS — E039 Hypothyroidism, unspecified: Secondary | ICD-10-CM | POA: Diagnosis not present

## 2022-02-03 DIAGNOSIS — R0600 Dyspnea, unspecified: Secondary | ICD-10-CM | POA: Diagnosis not present

## 2022-02-03 DIAGNOSIS — R69 Illness, unspecified: Secondary | ICD-10-CM | POA: Diagnosis not present

## 2022-02-03 DIAGNOSIS — R410 Disorientation, unspecified: Secondary | ICD-10-CM | POA: Diagnosis not present

## 2022-02-03 DIAGNOSIS — Z79890 Hormone replacement therapy (postmenopausal): Secondary | ICD-10-CM | POA: Diagnosis not present

## 2022-02-03 DIAGNOSIS — I10 Essential (primary) hypertension: Secondary | ICD-10-CM | POA: Diagnosis not present

## 2022-02-03 DIAGNOSIS — Z792 Long term (current) use of antibiotics: Secondary | ICD-10-CM | POA: Diagnosis not present

## 2022-02-03 DIAGNOSIS — A419 Sepsis, unspecified organism: Secondary | ICD-10-CM | POA: Diagnosis not present

## 2022-02-15 DIAGNOSIS — E039 Hypothyroidism, unspecified: Secondary | ICD-10-CM | POA: Diagnosis not present

## 2022-02-15 DIAGNOSIS — F0280 Dementia in other diseases classified elsewhere without behavioral disturbance: Secondary | ICD-10-CM | POA: Diagnosis not present

## 2022-02-15 DIAGNOSIS — R2689 Other abnormalities of gait and mobility: Secondary | ICD-10-CM | POA: Diagnosis not present

## 2022-02-15 DIAGNOSIS — M6281 Muscle weakness (generalized): Secondary | ICD-10-CM | POA: Diagnosis not present

## 2022-02-15 DIAGNOSIS — R69 Illness, unspecified: Secondary | ICD-10-CM | POA: Diagnosis not present

## 2022-02-15 DIAGNOSIS — N390 Urinary tract infection, site not specified: Secondary | ICD-10-CM | POA: Diagnosis not present

## 2022-02-15 DIAGNOSIS — I10 Essential (primary) hypertension: Secondary | ICD-10-CM | POA: Diagnosis not present

## 2022-02-15 DIAGNOSIS — E782 Mixed hyperlipidemia: Secondary | ICD-10-CM | POA: Diagnosis not present

## 2022-02-15 DIAGNOSIS — G894 Chronic pain syndrome: Secondary | ICD-10-CM | POA: Diagnosis not present

## 2022-02-16 DIAGNOSIS — E039 Hypothyroidism, unspecified: Secondary | ICD-10-CM | POA: Diagnosis not present

## 2022-02-16 DIAGNOSIS — E782 Mixed hyperlipidemia: Secondary | ICD-10-CM | POA: Diagnosis not present

## 2022-02-16 DIAGNOSIS — G894 Chronic pain syndrome: Secondary | ICD-10-CM | POA: Diagnosis not present

## 2022-02-16 DIAGNOSIS — M6281 Muscle weakness (generalized): Secondary | ICD-10-CM | POA: Diagnosis not present

## 2022-02-16 DIAGNOSIS — I10 Essential (primary) hypertension: Secondary | ICD-10-CM | POA: Diagnosis not present

## 2022-02-16 DIAGNOSIS — N390 Urinary tract infection, site not specified: Secondary | ICD-10-CM | POA: Diagnosis not present

## 2022-02-17 DIAGNOSIS — M6281 Muscle weakness (generalized): Secondary | ICD-10-CM | POA: Diagnosis not present

## 2022-02-17 DIAGNOSIS — G894 Chronic pain syndrome: Secondary | ICD-10-CM | POA: Diagnosis not present

## 2022-02-17 DIAGNOSIS — N390 Urinary tract infection, site not specified: Secondary | ICD-10-CM | POA: Diagnosis not present

## 2022-02-17 DIAGNOSIS — E782 Mixed hyperlipidemia: Secondary | ICD-10-CM | POA: Diagnosis not present

## 2022-02-17 DIAGNOSIS — I10 Essential (primary) hypertension: Secondary | ICD-10-CM | POA: Diagnosis not present

## 2022-02-17 DIAGNOSIS — E039 Hypothyroidism, unspecified: Secondary | ICD-10-CM | POA: Diagnosis not present

## 2022-02-27 DIAGNOSIS — N390 Urinary tract infection, site not specified: Secondary | ICD-10-CM | POA: Diagnosis not present

## 2022-02-27 DIAGNOSIS — G894 Chronic pain syndrome: Secondary | ICD-10-CM | POA: Diagnosis not present

## 2022-02-27 DIAGNOSIS — M6281 Muscle weakness (generalized): Secondary | ICD-10-CM | POA: Diagnosis not present

## 2022-02-27 DIAGNOSIS — E039 Hypothyroidism, unspecified: Secondary | ICD-10-CM | POA: Diagnosis not present

## 2022-02-27 DIAGNOSIS — E782 Mixed hyperlipidemia: Secondary | ICD-10-CM | POA: Diagnosis not present

## 2022-02-27 DIAGNOSIS — I10 Essential (primary) hypertension: Secondary | ICD-10-CM | POA: Diagnosis not present

## 2022-02-27 DIAGNOSIS — R69 Illness, unspecified: Secondary | ICD-10-CM | POA: Diagnosis not present

## 2022-03-01 DIAGNOSIS — I10 Essential (primary) hypertension: Secondary | ICD-10-CM | POA: Diagnosis not present

## 2022-03-01 DIAGNOSIS — E782 Mixed hyperlipidemia: Secondary | ICD-10-CM | POA: Diagnosis not present

## 2022-03-01 DIAGNOSIS — E039 Hypothyroidism, unspecified: Secondary | ICD-10-CM | POA: Diagnosis not present

## 2022-03-01 DIAGNOSIS — N390 Urinary tract infection, site not specified: Secondary | ICD-10-CM | POA: Diagnosis not present

## 2022-03-01 DIAGNOSIS — M6281 Muscle weakness (generalized): Secondary | ICD-10-CM | POA: Diagnosis not present

## 2022-03-01 DIAGNOSIS — G894 Chronic pain syndrome: Secondary | ICD-10-CM | POA: Diagnosis not present

## 2022-03-03 DIAGNOSIS — E039 Hypothyroidism, unspecified: Secondary | ICD-10-CM | POA: Diagnosis not present

## 2022-03-03 DIAGNOSIS — I10 Essential (primary) hypertension: Secondary | ICD-10-CM | POA: Diagnosis not present

## 2022-03-03 DIAGNOSIS — M6281 Muscle weakness (generalized): Secondary | ICD-10-CM | POA: Diagnosis not present

## 2022-03-03 DIAGNOSIS — E782 Mixed hyperlipidemia: Secondary | ICD-10-CM | POA: Diagnosis not present

## 2022-03-03 DIAGNOSIS — G894 Chronic pain syndrome: Secondary | ICD-10-CM | POA: Diagnosis not present

## 2022-03-03 DIAGNOSIS — N390 Urinary tract infection, site not specified: Secondary | ICD-10-CM | POA: Diagnosis not present

## 2022-03-09 DIAGNOSIS — I10 Essential (primary) hypertension: Secondary | ICD-10-CM | POA: Diagnosis not present

## 2022-03-09 DIAGNOSIS — E039 Hypothyroidism, unspecified: Secondary | ICD-10-CM | POA: Diagnosis not present

## 2022-03-09 DIAGNOSIS — R69 Illness, unspecified: Secondary | ICD-10-CM | POA: Diagnosis not present

## 2022-03-09 DIAGNOSIS — N390 Urinary tract infection, site not specified: Secondary | ICD-10-CM | POA: Diagnosis not present

## 2022-03-09 DIAGNOSIS — Z79891 Long term (current) use of opiate analgesic: Secondary | ICD-10-CM | POA: Diagnosis not present

## 2022-03-09 DIAGNOSIS — Z9181 History of falling: Secondary | ICD-10-CM | POA: Diagnosis not present

## 2022-03-14 DIAGNOSIS — Z9181 History of falling: Secondary | ICD-10-CM | POA: Diagnosis not present

## 2022-03-14 DIAGNOSIS — I10 Essential (primary) hypertension: Secondary | ICD-10-CM | POA: Diagnosis not present

## 2022-03-14 DIAGNOSIS — N390 Urinary tract infection, site not specified: Secondary | ICD-10-CM | POA: Diagnosis not present

## 2022-03-14 DIAGNOSIS — R69 Illness, unspecified: Secondary | ICD-10-CM | POA: Diagnosis not present

## 2022-03-14 DIAGNOSIS — Z79891 Long term (current) use of opiate analgesic: Secondary | ICD-10-CM | POA: Diagnosis not present

## 2022-03-14 DIAGNOSIS — E039 Hypothyroidism, unspecified: Secondary | ICD-10-CM | POA: Diagnosis not present

## 2022-03-16 DIAGNOSIS — E039 Hypothyroidism, unspecified: Secondary | ICD-10-CM | POA: Diagnosis not present

## 2022-03-16 DIAGNOSIS — Z9181 History of falling: Secondary | ICD-10-CM | POA: Diagnosis not present

## 2022-03-16 DIAGNOSIS — Z79891 Long term (current) use of opiate analgesic: Secondary | ICD-10-CM | POA: Diagnosis not present

## 2022-03-16 DIAGNOSIS — R69 Illness, unspecified: Secondary | ICD-10-CM | POA: Diagnosis not present

## 2022-03-16 DIAGNOSIS — N390 Urinary tract infection, site not specified: Secondary | ICD-10-CM | POA: Diagnosis not present

## 2022-03-16 DIAGNOSIS — I10 Essential (primary) hypertension: Secondary | ICD-10-CM | POA: Diagnosis not present

## 2022-03-17 DIAGNOSIS — N390 Urinary tract infection, site not specified: Secondary | ICD-10-CM | POA: Diagnosis not present

## 2022-03-17 DIAGNOSIS — E039 Hypothyroidism, unspecified: Secondary | ICD-10-CM | POA: Diagnosis not present

## 2022-03-17 DIAGNOSIS — R69 Illness, unspecified: Secondary | ICD-10-CM | POA: Diagnosis not present

## 2022-03-17 DIAGNOSIS — Z79891 Long term (current) use of opiate analgesic: Secondary | ICD-10-CM | POA: Diagnosis not present

## 2022-03-17 DIAGNOSIS — Z9181 History of falling: Secondary | ICD-10-CM | POA: Diagnosis not present

## 2022-03-17 DIAGNOSIS — I10 Essential (primary) hypertension: Secondary | ICD-10-CM | POA: Diagnosis not present

## 2022-03-20 DIAGNOSIS — E039 Hypothyroidism, unspecified: Secondary | ICD-10-CM | POA: Diagnosis not present

## 2022-03-20 DIAGNOSIS — R69 Illness, unspecified: Secondary | ICD-10-CM | POA: Diagnosis not present

## 2022-03-20 DIAGNOSIS — I10 Essential (primary) hypertension: Secondary | ICD-10-CM | POA: Diagnosis not present

## 2022-03-20 DIAGNOSIS — Z79891 Long term (current) use of opiate analgesic: Secondary | ICD-10-CM | POA: Diagnosis not present

## 2022-03-20 DIAGNOSIS — N390 Urinary tract infection, site not specified: Secondary | ICD-10-CM | POA: Diagnosis not present

## 2022-03-20 DIAGNOSIS — Z9181 History of falling: Secondary | ICD-10-CM | POA: Diagnosis not present

## 2022-03-21 DIAGNOSIS — N390 Urinary tract infection, site not specified: Secondary | ICD-10-CM | POA: Diagnosis not present

## 2022-03-21 DIAGNOSIS — Z79891 Long term (current) use of opiate analgesic: Secondary | ICD-10-CM | POA: Diagnosis not present

## 2022-03-21 DIAGNOSIS — E039 Hypothyroidism, unspecified: Secondary | ICD-10-CM | POA: Diagnosis not present

## 2022-03-21 DIAGNOSIS — Z9181 History of falling: Secondary | ICD-10-CM | POA: Diagnosis not present

## 2022-03-21 DIAGNOSIS — I10 Essential (primary) hypertension: Secondary | ICD-10-CM | POA: Diagnosis not present

## 2022-03-21 DIAGNOSIS — R69 Illness, unspecified: Secondary | ICD-10-CM | POA: Diagnosis not present

## 2022-03-22 DIAGNOSIS — R69 Illness, unspecified: Secondary | ICD-10-CM | POA: Diagnosis not present

## 2022-03-22 DIAGNOSIS — E039 Hypothyroidism, unspecified: Secondary | ICD-10-CM | POA: Diagnosis not present

## 2022-03-22 DIAGNOSIS — N390 Urinary tract infection, site not specified: Secondary | ICD-10-CM | POA: Diagnosis not present

## 2022-03-22 DIAGNOSIS — I10 Essential (primary) hypertension: Secondary | ICD-10-CM | POA: Diagnosis not present

## 2022-03-22 DIAGNOSIS — Z79891 Long term (current) use of opiate analgesic: Secondary | ICD-10-CM | POA: Diagnosis not present

## 2022-03-22 DIAGNOSIS — Z9181 History of falling: Secondary | ICD-10-CM | POA: Diagnosis not present

## 2022-03-23 DIAGNOSIS — Z9181 History of falling: Secondary | ICD-10-CM | POA: Diagnosis not present

## 2022-03-23 DIAGNOSIS — N390 Urinary tract infection, site not specified: Secondary | ICD-10-CM | POA: Diagnosis not present

## 2022-03-23 DIAGNOSIS — I10 Essential (primary) hypertension: Secondary | ICD-10-CM | POA: Diagnosis not present

## 2022-03-23 DIAGNOSIS — R69 Illness, unspecified: Secondary | ICD-10-CM | POA: Diagnosis not present

## 2022-03-23 DIAGNOSIS — E039 Hypothyroidism, unspecified: Secondary | ICD-10-CM | POA: Diagnosis not present

## 2022-03-23 DIAGNOSIS — Z79891 Long term (current) use of opiate analgesic: Secondary | ICD-10-CM | POA: Diagnosis not present

## 2022-03-28 DIAGNOSIS — I10 Essential (primary) hypertension: Secondary | ICD-10-CM | POA: Diagnosis not present

## 2022-03-28 DIAGNOSIS — Z9181 History of falling: Secondary | ICD-10-CM | POA: Diagnosis not present

## 2022-03-28 DIAGNOSIS — Z79891 Long term (current) use of opiate analgesic: Secondary | ICD-10-CM | POA: Diagnosis not present

## 2022-03-28 DIAGNOSIS — N390 Urinary tract infection, site not specified: Secondary | ICD-10-CM | POA: Diagnosis not present

## 2022-03-28 DIAGNOSIS — E039 Hypothyroidism, unspecified: Secondary | ICD-10-CM | POA: Diagnosis not present

## 2022-03-28 DIAGNOSIS — R69 Illness, unspecified: Secondary | ICD-10-CM | POA: Diagnosis not present

## 2022-03-29 DIAGNOSIS — I10 Essential (primary) hypertension: Secondary | ICD-10-CM | POA: Diagnosis not present

## 2022-03-29 DIAGNOSIS — Z9181 History of falling: Secondary | ICD-10-CM | POA: Diagnosis not present

## 2022-03-29 DIAGNOSIS — N390 Urinary tract infection, site not specified: Secondary | ICD-10-CM | POA: Diagnosis not present

## 2022-03-29 DIAGNOSIS — Z79891 Long term (current) use of opiate analgesic: Secondary | ICD-10-CM | POA: Diagnosis not present

## 2022-03-29 DIAGNOSIS — R69 Illness, unspecified: Secondary | ICD-10-CM | POA: Diagnosis not present

## 2022-03-29 DIAGNOSIS — E039 Hypothyroidism, unspecified: Secondary | ICD-10-CM | POA: Diagnosis not present

## 2022-03-31 DIAGNOSIS — Z9181 History of falling: Secondary | ICD-10-CM | POA: Diagnosis not present

## 2022-03-31 DIAGNOSIS — N390 Urinary tract infection, site not specified: Secondary | ICD-10-CM | POA: Diagnosis not present

## 2022-03-31 DIAGNOSIS — I10 Essential (primary) hypertension: Secondary | ICD-10-CM | POA: Diagnosis not present

## 2022-03-31 DIAGNOSIS — E039 Hypothyroidism, unspecified: Secondary | ICD-10-CM | POA: Diagnosis not present

## 2022-03-31 DIAGNOSIS — Z79891 Long term (current) use of opiate analgesic: Secondary | ICD-10-CM | POA: Diagnosis not present

## 2022-03-31 DIAGNOSIS — R69 Illness, unspecified: Secondary | ICD-10-CM | POA: Diagnosis not present

## 2022-04-03 DIAGNOSIS — R54 Age-related physical debility: Secondary | ICD-10-CM | POA: Diagnosis not present

## 2022-04-03 DIAGNOSIS — R69 Illness, unspecified: Secondary | ICD-10-CM | POA: Diagnosis not present

## 2022-04-03 DIAGNOSIS — I10 Essential (primary) hypertension: Secondary | ICD-10-CM | POA: Diagnosis not present

## 2022-04-03 DIAGNOSIS — E039 Hypothyroidism, unspecified: Secondary | ICD-10-CM | POA: Diagnosis not present

## 2022-04-03 DIAGNOSIS — K219 Gastro-esophageal reflux disease without esophagitis: Secondary | ICD-10-CM | POA: Diagnosis not present

## 2022-04-03 DIAGNOSIS — Z6824 Body mass index (BMI) 24.0-24.9, adult: Secondary | ICD-10-CM | POA: Diagnosis not present

## 2022-04-03 DIAGNOSIS — R300 Dysuria: Secondary | ICD-10-CM | POA: Diagnosis not present

## 2022-04-03 DIAGNOSIS — F0390 Unspecified dementia without behavioral disturbance: Secondary | ICD-10-CM | POA: Diagnosis not present

## 2022-04-03 DIAGNOSIS — M170 Bilateral primary osteoarthritis of knee: Secondary | ICD-10-CM | POA: Diagnosis not present

## 2022-04-04 DIAGNOSIS — Z79891 Long term (current) use of opiate analgesic: Secondary | ICD-10-CM | POA: Diagnosis not present

## 2022-04-04 DIAGNOSIS — R69 Illness, unspecified: Secondary | ICD-10-CM | POA: Diagnosis not present

## 2022-04-04 DIAGNOSIS — E039 Hypothyroidism, unspecified: Secondary | ICD-10-CM | POA: Diagnosis not present

## 2022-04-04 DIAGNOSIS — N390 Urinary tract infection, site not specified: Secondary | ICD-10-CM | POA: Diagnosis not present

## 2022-04-04 DIAGNOSIS — I10 Essential (primary) hypertension: Secondary | ICD-10-CM | POA: Diagnosis not present

## 2022-04-04 DIAGNOSIS — Z9181 History of falling: Secondary | ICD-10-CM | POA: Diagnosis not present

## 2022-04-05 DIAGNOSIS — Z9181 History of falling: Secondary | ICD-10-CM | POA: Diagnosis not present

## 2022-04-05 DIAGNOSIS — I10 Essential (primary) hypertension: Secondary | ICD-10-CM | POA: Diagnosis not present

## 2022-04-05 DIAGNOSIS — E039 Hypothyroidism, unspecified: Secondary | ICD-10-CM | POA: Diagnosis not present

## 2022-04-05 DIAGNOSIS — Z79891 Long term (current) use of opiate analgesic: Secondary | ICD-10-CM | POA: Diagnosis not present

## 2022-04-05 DIAGNOSIS — R69 Illness, unspecified: Secondary | ICD-10-CM | POA: Diagnosis not present

## 2022-04-05 DIAGNOSIS — N390 Urinary tract infection, site not specified: Secondary | ICD-10-CM | POA: Diagnosis not present

## 2022-04-11 DIAGNOSIS — N390 Urinary tract infection, site not specified: Secondary | ICD-10-CM | POA: Diagnosis not present

## 2022-04-11 DIAGNOSIS — E039 Hypothyroidism, unspecified: Secondary | ICD-10-CM | POA: Diagnosis not present

## 2022-04-11 DIAGNOSIS — Z9181 History of falling: Secondary | ICD-10-CM | POA: Diagnosis not present

## 2022-04-11 DIAGNOSIS — Z79891 Long term (current) use of opiate analgesic: Secondary | ICD-10-CM | POA: Diagnosis not present

## 2022-04-11 DIAGNOSIS — R69 Illness, unspecified: Secondary | ICD-10-CM | POA: Diagnosis not present

## 2022-04-11 DIAGNOSIS — I10 Essential (primary) hypertension: Secondary | ICD-10-CM | POA: Diagnosis not present

## 2022-04-12 DIAGNOSIS — E039 Hypothyroidism, unspecified: Secondary | ICD-10-CM | POA: Diagnosis not present

## 2022-04-12 DIAGNOSIS — Z9181 History of falling: Secondary | ICD-10-CM | POA: Diagnosis not present

## 2022-04-12 DIAGNOSIS — R69 Illness, unspecified: Secondary | ICD-10-CM | POA: Diagnosis not present

## 2022-04-12 DIAGNOSIS — Z79891 Long term (current) use of opiate analgesic: Secondary | ICD-10-CM | POA: Diagnosis not present

## 2022-04-12 DIAGNOSIS — N390 Urinary tract infection, site not specified: Secondary | ICD-10-CM | POA: Diagnosis not present

## 2022-04-12 DIAGNOSIS — I10 Essential (primary) hypertension: Secondary | ICD-10-CM | POA: Diagnosis not present

## 2022-04-19 DIAGNOSIS — Z9181 History of falling: Secondary | ICD-10-CM | POA: Diagnosis not present

## 2022-04-19 DIAGNOSIS — I10 Essential (primary) hypertension: Secondary | ICD-10-CM | POA: Diagnosis not present

## 2022-04-19 DIAGNOSIS — Z79891 Long term (current) use of opiate analgesic: Secondary | ICD-10-CM | POA: Diagnosis not present

## 2022-04-19 DIAGNOSIS — R69 Illness, unspecified: Secondary | ICD-10-CM | POA: Diagnosis not present

## 2022-04-19 DIAGNOSIS — N390 Urinary tract infection, site not specified: Secondary | ICD-10-CM | POA: Diagnosis not present

## 2022-04-19 DIAGNOSIS — E039 Hypothyroidism, unspecified: Secondary | ICD-10-CM | POA: Diagnosis not present

## 2022-04-28 DIAGNOSIS — W1812XA Fall from or off toilet with subsequent striking against object, initial encounter: Secondary | ICD-10-CM | POA: Diagnosis not present

## 2022-04-28 DIAGNOSIS — Z880 Allergy status to penicillin: Secondary | ICD-10-CM | POA: Diagnosis not present

## 2022-04-28 DIAGNOSIS — Z79899 Other long term (current) drug therapy: Secondary | ICD-10-CM | POA: Diagnosis not present

## 2022-04-28 DIAGNOSIS — W182XXA Fall in (into) shower or empty bathtub, initial encounter: Secondary | ICD-10-CM | POA: Diagnosis not present

## 2022-04-28 DIAGNOSIS — E079 Disorder of thyroid, unspecified: Secondary | ICD-10-CM | POA: Diagnosis not present

## 2022-04-28 DIAGNOSIS — Z7902 Long term (current) use of antithrombotics/antiplatelets: Secondary | ICD-10-CM | POA: Diagnosis not present

## 2022-04-28 DIAGNOSIS — Z66 Do not resuscitate: Secondary | ICD-10-CM | POA: Diagnosis not present

## 2022-04-28 DIAGNOSIS — I10 Essential (primary) hypertension: Secondary | ICD-10-CM | POA: Diagnosis not present

## 2022-04-28 DIAGNOSIS — Z743 Need for continuous supervision: Secondary | ICD-10-CM | POA: Diagnosis not present

## 2022-04-28 DIAGNOSIS — S0990XA Unspecified injury of head, initial encounter: Secondary | ICD-10-CM | POA: Diagnosis not present

## 2022-04-28 DIAGNOSIS — S0181XA Laceration without foreign body of other part of head, initial encounter: Secondary | ICD-10-CM | POA: Diagnosis not present

## 2022-04-28 DIAGNOSIS — Z79890 Hormone replacement therapy (postmenopausal): Secondary | ICD-10-CM | POA: Diagnosis not present

## 2022-04-28 DIAGNOSIS — W19XXXA Unspecified fall, initial encounter: Secondary | ICD-10-CM | POA: Diagnosis not present

## 2022-04-28 DIAGNOSIS — Z7901 Long term (current) use of anticoagulants: Secondary | ICD-10-CM | POA: Diagnosis not present

## 2022-05-15 DIAGNOSIS — M533 Sacrococcygeal disorders, not elsewhere classified: Secondary | ICD-10-CM | POA: Diagnosis not present

## 2022-05-15 DIAGNOSIS — Z6823 Body mass index (BMI) 23.0-23.9, adult: Secondary | ICD-10-CM | POA: Diagnosis not present

## 2022-06-27 DIAGNOSIS — H59812 Chorioretinal scars after surgery for detachment, left eye: Secondary | ICD-10-CM | POA: Diagnosis not present

## 2022-08-11 DIAGNOSIS — H524 Presbyopia: Secondary | ICD-10-CM | POA: Diagnosis not present

## 2022-10-12 DIAGNOSIS — E039 Hypothyroidism, unspecified: Secondary | ICD-10-CM | POA: Diagnosis not present

## 2022-10-12 DIAGNOSIS — F0390 Unspecified dementia without behavioral disturbance: Secondary | ICD-10-CM | POA: Diagnosis not present

## 2022-10-12 DIAGNOSIS — K219 Gastro-esophageal reflux disease without esophagitis: Secondary | ICD-10-CM | POA: Diagnosis not present

## 2022-10-12 DIAGNOSIS — R54 Age-related physical debility: Secondary | ICD-10-CM | POA: Diagnosis not present

## 2022-10-12 DIAGNOSIS — M170 Bilateral primary osteoarthritis of knee: Secondary | ICD-10-CM | POA: Diagnosis not present

## 2022-10-12 DIAGNOSIS — Z6822 Body mass index (BMI) 22.0-22.9, adult: Secondary | ICD-10-CM | POA: Diagnosis not present

## 2022-10-12 DIAGNOSIS — I10 Essential (primary) hypertension: Secondary | ICD-10-CM | POA: Diagnosis not present
